# Patient Record
Sex: Female | Born: 1935 | Race: White | Hispanic: No | Marital: Married
Health system: Southern US, Community
[De-identification: ages and names within clinical notes are randomized; demographics above are authoritative.]

---

## 2004-02-19 ENCOUNTER — Other Ambulatory Visit: Payer: Self-pay

## 2004-02-20 ENCOUNTER — Observation Stay: Payer: Self-pay | Admitting: Internal Medicine

## 2004-05-09 ENCOUNTER — Ambulatory Visit: Payer: Self-pay | Admitting: Unknown Physician Specialty

## 2004-12-07 ENCOUNTER — Ambulatory Visit: Payer: Self-pay | Admitting: Unknown Physician Specialty

## 2005-05-25 ENCOUNTER — Ambulatory Visit: Payer: Self-pay | Admitting: Unknown Physician Specialty

## 2006-04-26 ENCOUNTER — Ambulatory Visit: Payer: Self-pay | Admitting: Unknown Physician Specialty

## 2006-05-07 ENCOUNTER — Ambulatory Visit: Payer: Self-pay | Admitting: Internal Medicine

## 2006-05-31 ENCOUNTER — Ambulatory Visit: Payer: Self-pay | Admitting: Unknown Physician Specialty

## 2006-08-15 ENCOUNTER — Ambulatory Visit: Payer: Self-pay | Admitting: Unknown Physician Specialty

## 2007-07-16 ENCOUNTER — Ambulatory Visit: Payer: Self-pay | Admitting: Unknown Physician Specialty

## 2008-12-01 ENCOUNTER — Ambulatory Visit: Payer: Self-pay | Admitting: Unknown Physician Specialty

## 2009-06-30 ENCOUNTER — Ambulatory Visit: Payer: Self-pay | Admitting: Unknown Physician Specialty

## 2009-09-28 ENCOUNTER — Encounter: Admission: RE | Admit: 2009-09-28 | Discharge: 2009-09-28 | Payer: Self-pay | Admitting: Neurosurgery

## 2009-10-18 ENCOUNTER — Encounter: Admission: RE | Admit: 2009-10-18 | Discharge: 2009-10-18 | Payer: Self-pay | Admitting: Neurosurgery

## 2009-12-13 ENCOUNTER — Ambulatory Visit: Payer: Self-pay | Admitting: Unknown Physician Specialty

## 2011-02-08 ENCOUNTER — Ambulatory Visit: Payer: Self-pay | Admitting: Unknown Physician Specialty

## 2013-06-02 ENCOUNTER — Emergency Department: Payer: Self-pay | Admitting: Emergency Medicine

## 2013-06-02 LAB — COMPREHENSIVE METABOLIC PANEL
ANION GAP: 5 — AB (ref 7–16)
Albumin: 3.3 g/dL — ABNORMAL LOW (ref 3.4–5.0)
Alkaline Phosphatase: 80 U/L
BUN: 16 mg/dL (ref 7–18)
Bilirubin,Total: 0.3 mg/dL (ref 0.2–1.0)
CALCIUM: 9.6 mg/dL (ref 8.5–10.1)
Chloride: 96 mmol/L — ABNORMAL LOW (ref 98–107)
Co2: 31 mmol/L (ref 21–32)
Creatinine: 1.09 mg/dL (ref 0.60–1.30)
EGFR (Non-African Amer.): 48 — ABNORMAL LOW
GFR CALC AF AMER: 55 — AB
Glucose: 116 mg/dL — ABNORMAL HIGH (ref 65–99)
Osmolality: 267 (ref 275–301)
POTASSIUM: 4.2 mmol/L (ref 3.5–5.1)
SGOT(AST): 40 U/L — ABNORMAL HIGH (ref 15–37)
SGPT (ALT): 30 U/L (ref 12–78)
SODIUM: 132 mmol/L — AB (ref 136–145)
TOTAL PROTEIN: 7.5 g/dL (ref 6.4–8.2)

## 2013-06-02 LAB — URINALYSIS, COMPLETE
BLOOD: NEGATIVE
Bilirubin,UR: NEGATIVE
GLUCOSE, UR: NEGATIVE mg/dL (ref 0–75)
Hyaline Cast: 6
Ketone: NEGATIVE
Nitrite: NEGATIVE
Ph: 7 (ref 4.5–8.0)
Protein: NEGATIVE
SPECIFIC GRAVITY: 1.015 (ref 1.003–1.030)
Squamous Epithelial: 2

## 2013-06-02 LAB — CBC
HCT: 34.5 % — AB (ref 35.0–47.0)
HGB: 11.4 g/dL — ABNORMAL LOW (ref 12.0–16.0)
MCH: 31.2 pg (ref 26.0–34.0)
MCHC: 33 g/dL (ref 32.0–36.0)
MCV: 95 fL (ref 80–100)
Platelet: 289 10*3/uL (ref 150–440)
RBC: 3.65 10*6/uL — ABNORMAL LOW (ref 3.80–5.20)
RDW: 13.4 % (ref 11.5–14.5)
WBC: 6.2 10*3/uL (ref 3.6–11.0)

## 2013-06-02 LAB — ACETAMINOPHEN LEVEL

## 2013-06-02 LAB — ETHANOL

## 2013-06-02 LAB — LIPASE, BLOOD: Lipase: 197 U/L (ref 73–393)

## 2013-06-02 LAB — SALICYLATE LEVEL: Salicylates, Serum: <1.7 mg/dL

## 2013-06-04 ENCOUNTER — Observation Stay: Payer: Self-pay

## 2013-06-04 LAB — COMPREHENSIVE METABOLIC PANEL
Albumin: 3.4 g/dL (ref 3.4–5.0)
Alkaline Phosphatase: 84 U/L
Anion Gap: 4 — ABNORMAL LOW (ref 7–16)
BILIRUBIN TOTAL: 0.3 mg/dL (ref 0.2–1.0)
BUN: 13 mg/dL (ref 7–18)
CO2: 28 mmol/L (ref 21–32)
Calcium, Total: 10 mg/dL (ref 8.5–10.1)
Chloride: 100 mmol/L (ref 98–107)
Creatinine: 1.09 mg/dL (ref 0.60–1.30)
GFR CALC AF AMER: 55 — AB
GFR CALC NON AF AMER: 48 — AB
Glucose: 101 mg/dL — ABNORMAL HIGH (ref 65–99)
OSMOLALITY: 265 (ref 275–301)
POTASSIUM: 3.7 mmol/L (ref 3.5–5.1)
SGOT(AST): 40 U/L — ABNORMAL HIGH (ref 15–37)
SGPT (ALT): 37 U/L (ref 12–78)
Sodium: 132 mmol/L — ABNORMAL LOW (ref 136–145)
TOTAL PROTEIN: 7.5 g/dL (ref 6.4–8.2)

## 2013-06-04 LAB — CBC
HCT: 36.3 % (ref 35.0–47.0)
HGB: 12.4 g/dL (ref 12.0–16.0)
MCH: 32 pg (ref 26.0–34.0)
MCHC: 34.1 g/dL (ref 32.0–36.0)
MCV: 94 fL (ref 80–100)
Platelet: 250 10*3/uL (ref 150–440)
RBC: 3.85 10*6/uL (ref 3.80–5.20)
RDW: 13.6 % (ref 11.5–14.5)
WBC: 8.1 10*3/uL (ref 3.6–11.0)

## 2013-06-04 LAB — URINALYSIS, COMPLETE
BACTERIA: NONE SEEN
BILIRUBIN, UR: NEGATIVE
Blood: NEGATIVE
Glucose,UR: NEGATIVE mg/dL (ref 0–75)
KETONE: NEGATIVE
Leukocyte Esterase: NEGATIVE
NITRITE: NEGATIVE
PROTEIN: NEGATIVE
Ph: 6 (ref 4.5–8.0)
Specific Gravity: 1.013 (ref 1.003–1.030)
WBC UR: 2 /HPF (ref 0–5)

## 2013-06-04 LAB — TROPONIN I: Troponin-I: <0.02 ng/mL

## 2013-06-05 LAB — BASIC METABOLIC PANEL
Anion Gap: 6 — ABNORMAL LOW (ref 7–16)
BUN: 11 mg/dL (ref 7–18)
Calcium, Total: 9.8 mg/dL (ref 8.5–10.1)
Chloride: 100 mmol/L (ref 98–107)
Co2: 30 mmol/L (ref 21–32)
Creatinine: 0.95 mg/dL (ref 0.60–1.30)
EGFR (African American): >60
EGFR (Non-African Amer.): 56 — ABNORMAL LOW
Glucose: 86 mg/dL (ref 65–99)
Osmolality: 271 (ref 275–301)
Potassium: 3.6 mmol/L (ref 3.5–5.1)
Sodium: 136 mmol/L (ref 136–145)

## 2013-06-05 LAB — URINE CULTURE

## 2013-10-08 ENCOUNTER — Other Ambulatory Visit: Payer: Self-pay

## 2013-10-08 LAB — URINALYSIS, COMPLETE
BILIRUBIN, UR: NEGATIVE
BLOOD: NEGATIVE
GLUCOSE, UR: NEGATIVE mg/dL (ref 0–75)
KETONE: NEGATIVE
Nitrite: NEGATIVE
PH: 5 (ref 4.5–8.0)
Protein: NEGATIVE
RBC,UR: 4 /HPF (ref 0–5)
SPECIFIC GRAVITY: 1.016 (ref 1.003–1.030)
SQUAMOUS EPITHELIAL: NONE SEEN

## 2013-10-10 LAB — URINE CULTURE

## 2013-11-17 ENCOUNTER — Inpatient Hospital Stay: Payer: Self-pay | Admitting: Internal Medicine

## 2013-11-17 LAB — COMPREHENSIVE METABOLIC PANEL
ALK PHOS: 93 U/L
ALT: 40 U/L
ANION GAP: 10 (ref 7–16)
AST: 34 U/L (ref 15–37)
Albumin: 2.9 g/dL — ABNORMAL LOW (ref 3.4–5.0)
BILIRUBIN TOTAL: 0.2 mg/dL (ref 0.2–1.0)
BUN: 21 mg/dL — ABNORMAL HIGH (ref 7–18)
CO2: 24 mmol/L (ref 21–32)
Calcium, Total: 9.4 mg/dL (ref 8.5–10.1)
Chloride: 103 mmol/L (ref 98–107)
Creatinine: 1.04 mg/dL (ref 0.60–1.30)
EGFR (Non-African Amer.): 54 — ABNORMAL LOW
GLUCOSE: 106 mg/dL — AB (ref 65–99)
Osmolality: 277 (ref 275–301)
POTASSIUM: 4.1 mmol/L (ref 3.5–5.1)
Sodium: 137 mmol/L (ref 136–145)
Total Protein: 6.9 g/dL (ref 6.4–8.2)

## 2013-11-17 LAB — URINALYSIS, COMPLETE
BLOOD: NEGATIVE
Bilirubin,UR: NEGATIVE
Glucose,UR: NEGATIVE mg/dL (ref 0–75)
Ketone: NEGATIVE
Nitrite: NEGATIVE
PROTEIN: NEGATIVE
Ph: 6 (ref 4.5–8.0)
SPECIFIC GRAVITY: 1.018 (ref 1.003–1.030)
Squamous Epithelial: 3

## 2013-11-17 LAB — CBC
HCT: 35.8 % (ref 35.0–47.0)
HGB: 12.1 g/dL (ref 12.0–16.0)
MCH: 32.3 pg (ref 26.0–34.0)
MCHC: 33.6 g/dL (ref 32.0–36.0)
MCV: 96 fL (ref 80–100)
PLATELETS: 249 10*3/uL (ref 150–440)
RBC: 3.73 10*6/uL — AB (ref 3.80–5.20)
RDW: 13.4 % (ref 11.5–14.5)
WBC: 6.8 10*3/uL (ref 3.6–11.0)

## 2013-11-17 LAB — CK-MB

## 2013-11-17 LAB — TROPONIN I: Troponin-I: <0.02 ng/mL

## 2013-11-18 LAB — TROPONIN I

## 2013-11-18 LAB — CK-MB
CK-MB: 0.5 ng/mL (ref 0.5–3.6)
CK-MB: <0.5 ng/mL — ABNORMAL LOW (ref 0.5–3.6)

## 2013-11-21 LAB — URINE CULTURE

## 2013-12-03 ENCOUNTER — Observation Stay: Payer: Self-pay | Admitting: Internal Medicine

## 2013-12-03 LAB — URINALYSIS, COMPLETE
BACTERIA: NONE SEEN
BILIRUBIN, UR: NEGATIVE
Blood: NEGATIVE
Glucose,UR: NEGATIVE mg/dL (ref 0–75)
Hyaline Cast: 2
Ketone: NEGATIVE
Leukocyte Esterase: NEGATIVE
Nitrite: NEGATIVE
PH: 5 (ref 4.5–8.0)
Protein: NEGATIVE
RBC,UR: 2 /HPF (ref 0–5)
SPECIFIC GRAVITY: 1.021 (ref 1.003–1.030)
Squamous Epithelial: <1
WBC UR: 1 /HPF (ref 0–5)

## 2013-12-03 LAB — BASIC METABOLIC PANEL
ANION GAP: 5 — AB (ref 7–16)
BUN: 23 mg/dL — AB (ref 7–18)
CHLORIDE: 105 mmol/L (ref 98–107)
CO2: 30 mmol/L (ref 21–32)
Calcium, Total: 9.5 mg/dL (ref 8.5–10.1)
Creatinine: 1.11 mg/dL (ref 0.60–1.30)
EGFR (Non-African Amer.): 50 — ABNORMAL LOW
GLUCOSE: 109 mg/dL — AB (ref 65–99)
OSMOLALITY: 284 (ref 275–301)
POTASSIUM: 4.2 mmol/L (ref 3.5–5.1)
Sodium: 140 mmol/L (ref 136–145)

## 2013-12-03 LAB — TROPONIN I: Troponin-I: <0.02 ng/mL

## 2013-12-03 LAB — CBC WITH DIFFERENTIAL/PLATELET
BASOS ABS: 0.1 10*3/uL (ref 0.0–0.1)
BASOS PCT: 1 %
Eosinophil #: 0.5 10*3/uL (ref 0.0–0.7)
Eosinophil %: 5.1 %
HCT: 34.4 % — ABNORMAL LOW (ref 35.0–47.0)
HGB: 11.4 g/dL — AB (ref 12.0–16.0)
LYMPHS ABS: 1.8 10*3/uL (ref 1.0–3.6)
LYMPHS PCT: 18.2 %
MCH: 31.9 pg (ref 26.0–34.0)
MCHC: 33.1 g/dL (ref 32.0–36.0)
MCV: 96 fL (ref 80–100)
MONO ABS: 0.8 x10 3/mm (ref 0.2–0.9)
MONOS PCT: 8.2 %
Neutrophil #: 6.6 10*3/uL — ABNORMAL HIGH (ref 1.4–6.5)
Neutrophil %: 67.5 %
PLATELETS: 296 10*3/uL (ref 150–440)
RBC: 3.58 10*6/uL — AB (ref 3.80–5.20)
RDW: 13.3 % (ref 11.5–14.5)
WBC: 9.9 10*3/uL (ref 3.6–11.0)

## 2013-12-03 LAB — CK TOTAL AND CKMB (NOT AT ARMC): CK, TOTAL: 22 U/L — AB

## 2013-12-04 LAB — CBC WITH DIFFERENTIAL/PLATELET
BASOS ABS: 0.1 10*3/uL (ref 0.0–0.1)
BASOS PCT: 1 %
EOS ABS: 0.3 10*3/uL (ref 0.0–0.7)
Eosinophil %: 3.9 %
HCT: 31.4 % — ABNORMAL LOW (ref 35.0–47.0)
HGB: 10.5 g/dL — ABNORMAL LOW (ref 12.0–16.0)
LYMPHS ABS: 1.7 10*3/uL (ref 1.0–3.6)
Lymphocyte %: 21.7 %
MCH: 31.9 pg (ref 26.0–34.0)
MCHC: 33.5 g/dL (ref 32.0–36.0)
MCV: 95 fL (ref 80–100)
MONO ABS: 0.6 x10 3/mm (ref 0.2–0.9)
Monocyte %: 7.6 %
Neutrophil #: 5 10*3/uL (ref 1.4–6.5)
Neutrophil %: 65.8 %
Platelet: 248 10*3/uL (ref 150–440)
RBC: 3.29 10*6/uL — ABNORMAL LOW (ref 3.80–5.20)
RDW: 13.3 % (ref 11.5–14.5)
WBC: 7.6 10*3/uL (ref 3.6–11.0)

## 2013-12-04 LAB — BASIC METABOLIC PANEL
Anion Gap: 4 — ABNORMAL LOW (ref 7–16)
BUN: 21 mg/dL — AB (ref 7–18)
CALCIUM: 8.8 mg/dL (ref 8.5–10.1)
CHLORIDE: 107 mmol/L (ref 98–107)
CO2: 30 mmol/L (ref 21–32)
CREATININE: 0.88 mg/dL (ref 0.60–1.30)
EGFR (African American): >60
GLUCOSE: 87 mg/dL (ref 65–99)
OSMOLALITY: 284 (ref 275–301)
POTASSIUM: 3.8 mmol/L (ref 3.5–5.1)
Sodium: 141 mmol/L (ref 136–145)

## 2013-12-04 LAB — MAGNESIUM: MAGNESIUM: 1.6 mg/dL — AB

## 2013-12-04 LAB — TSH: Thyroid Stimulating Horm: 1.93 u[IU]/mL

## 2013-12-05 LAB — URINE CULTURE

## 2013-12-08 LAB — CULTURE, BLOOD (SINGLE)

## 2013-12-20 ENCOUNTER — Emergency Department: Payer: Self-pay | Admitting: Emergency Medicine

## 2013-12-20 LAB — COMPREHENSIVE METABOLIC PANEL
ALK PHOS: 113 U/L
ALT: 30 U/L
Albumin: 3.1 g/dL — ABNORMAL LOW (ref 3.4–5.0)
Anion Gap: 9 (ref 7–16)
BILIRUBIN TOTAL: 0.3 mg/dL (ref 0.2–1.0)
BUN: 21 mg/dL — AB (ref 7–18)
CALCIUM: 10.2 mg/dL — AB (ref 8.5–10.1)
CHLORIDE: 101 mmol/L (ref 98–107)
Co2: 29 mmol/L (ref 21–32)
Creatinine: 1.12 mg/dL (ref 0.60–1.30)
EGFR (African American): >60
GFR CALC NON AF AMER: 50 — AB
Glucose: 151 mg/dL — ABNORMAL HIGH (ref 65–99)
Osmolality: 283 (ref 275–301)
Potassium: 3.9 mmol/L (ref 3.5–5.1)
SGOT(AST): 39 U/L — ABNORMAL HIGH (ref 15–37)
SODIUM: 139 mmol/L (ref 136–145)
TOTAL PROTEIN: 7.7 g/dL (ref 6.4–8.2)

## 2013-12-20 LAB — URINALYSIS, COMPLETE
Bilirubin,UR: NEGATIVE
Blood: NEGATIVE
Glucose,UR: NEGATIVE mg/dL (ref 0–75)
Ketone: NEGATIVE
NITRITE: NEGATIVE
Ph: 5 (ref 4.5–8.0)
Protein: 30
RBC,UR: 53 /HPF (ref 0–5)
Specific Gravity: 1.025 (ref 1.003–1.030)
Squamous Epithelial: 1
WBC UR: 798 /HPF (ref 0–5)

## 2013-12-20 LAB — CBC
HCT: 37.5 % (ref 35.0–47.0)
HGB: 12.4 g/dL (ref 12.0–16.0)
MCH: 31.7 pg (ref 26.0–34.0)
MCHC: 33 g/dL (ref 32.0–36.0)
MCV: 96 fL (ref 80–100)
Platelet: 308 10*3/uL (ref 150–440)
RBC: 3.91 10*6/uL (ref 3.80–5.20)
RDW: 13.5 % (ref 11.5–14.5)
WBC: 12.8 10*3/uL — AB (ref 3.6–11.0)

## 2013-12-20 LAB — MAGNESIUM: Magnesium: 1.9 mg/dL

## 2013-12-20 LAB — TROPONIN I: Troponin-I: <0.02 ng/mL

## 2014-02-01 LAB — COMPREHENSIVE METABOLIC PANEL
ALBUMIN: 3 g/dL — AB (ref 3.4–5.0)
ALK PHOS: 125 U/L — AB
ALT: 46 U/L
ANION GAP: 6 — AB (ref 7–16)
AST: 54 U/L — AB (ref 15–37)
BUN: 17 mg/dL (ref 7–18)
Bilirubin,Total: 0.1 mg/dL — ABNORMAL LOW (ref 0.2–1.0)
CHLORIDE: 99 mmol/L (ref 98–107)
CREATININE: 0.96 mg/dL (ref 0.60–1.30)
Calcium, Total: 9.7 mg/dL (ref 8.5–10.1)
Co2: 28 mmol/L (ref 21–32)
EGFR (African American): >60
EGFR (Non-African Amer.): 59 — ABNORMAL LOW
GLUCOSE: 115 mg/dL — AB (ref 65–99)
Osmolality: 269 (ref 275–301)
Potassium: 4.4 mmol/L (ref 3.5–5.1)
Sodium: 133 mmol/L — ABNORMAL LOW (ref 136–145)
TOTAL PROTEIN: 7.7 g/dL (ref 6.4–8.2)

## 2014-02-01 LAB — CARBAMAZEPINE LEVEL, TOTAL: CARBAMAZEPINE: 5.7 ug/mL (ref 4.0–12.0)

## 2014-02-01 LAB — URINALYSIS, COMPLETE
BACTERIA: NONE SEEN
BILIRUBIN, UR: NEGATIVE
BLOOD: NEGATIVE
GLUCOSE, UR: NEGATIVE mg/dL (ref 0–75)
Hyaline Cast: 11
KETONE: NEGATIVE
LEUKOCYTE ESTERASE: NEGATIVE
Nitrite: NEGATIVE
PH: 7 (ref 4.5–8.0)
Protein: NEGATIVE
SPECIFIC GRAVITY: 1.015 (ref 1.003–1.030)
WBC UR: 1 /HPF (ref 0–5)

## 2014-02-01 LAB — CBC
HCT: 36 % (ref 35.0–47.0)
HGB: 12 g/dL (ref 12.0–16.0)
MCH: 31.8 pg (ref 26.0–34.0)
MCHC: 33.3 g/dL (ref 32.0–36.0)
MCV: 96 fL (ref 80–100)
Platelet: 283 10*3/uL (ref 150–440)
RBC: 3.77 10*6/uL — ABNORMAL LOW (ref 3.80–5.20)
RDW: 13.3 % (ref 11.5–14.5)
WBC: 9.3 10*3/uL (ref 3.6–11.0)

## 2014-02-01 LAB — TROPONIN I: Troponin-I: <0.02 ng/mL

## 2014-02-02 ENCOUNTER — Observation Stay: Payer: Self-pay | Admitting: Internal Medicine

## 2014-02-02 LAB — TROPONIN I
Troponin-I: <0.02 ng/mL
Troponin-I: <0.02 ng/mL

## 2014-03-17 ENCOUNTER — Ambulatory Visit
Admit: 2014-03-17 | Disposition: A | Discharge status: Home / Self Care | Payer: Self-pay | Attending: Hospice and Palliative Medicine | Admitting: Hospice and Palliative Medicine

## 2014-03-23 ENCOUNTER — Emergency Department: Payer: Self-pay | Admitting: Emergency Medicine

## 2014-04-03 ENCOUNTER — Inpatient Hospital Stay: Payer: Self-pay | Admitting: Internal Medicine

## 2014-04-09 LAB — BASIC METABOLIC PANEL
Anion Gap: 2 — ABNORMAL LOW (ref 7–16)
BUN: 5 mg/dL — ABNORMAL LOW
CHLORIDE: 103 mmol/L
Calcium, Total: 8.8 mg/dL — ABNORMAL LOW
Co2: 32 mmol/L
Creatinine: 0.63 mg/dL
EGFR (African American): >60
EGFR (Non-African Amer.): >60
Glucose: 84 mg/dL
Potassium: 4.1 mmol/L
Sodium: 137 mmol/L

## 2014-04-09 LAB — CLOSTRIDIUM DIFFICILE(ARMC)

## 2014-04-13 LAB — STOOL CULTURE

## 2014-04-17 ENCOUNTER — Ambulatory Visit
Admit: 2014-04-17 | Disposition: A | Discharge status: Home / Self Care | Payer: Self-pay | Attending: Hospice and Palliative Medicine | Admitting: Hospice and Palliative Medicine

## 2014-04-24 LAB — CULTURE, BLOOD (SINGLE)

## 2014-05-09 NOTE — H&P (Signed)
PATIENT NAME:  Audrey [name], Audrey [name] MR#:  [id number] DATE OF BIRTH:  1930/09/19  DATE OF ADMISSION:  06/04/2013  PRIMARY CARE PHYSICIAN: Lora Paula, PA, at [facility].   REASON FOR ADMISSION: Possible seizures or syncope.   HISTORY OF PRESENT ILLNESS: This is a very pleasant 81 year old female whose husband died 2 weeks ago. She has a baseline history of advancing Alzheimer's and is on Namenda and Aricept. She has had 3 episodes where she has become unresponsive and stares off into the distance, lasting 1 to 2 minutes. When she comes to, she is somewhat confused. She has not had any actual loss of consciousness or syncope or seizure activity noted with it. She was seen May 18th at the ER and diagnosed with a urinary tract infection. She was discharged on Bactrim. These symptoms recurred again today and she was brought to the Emergency Room by EMS. Apparently at the time of EMS arrival, her sugars were checked and those were normal. She has been eating well. She is in between these episodes at her baseline of mental status, which is confusion, not oriented to place but able to recognize her daughters. She has been living at home since her husband died alone, but they have 24-hour care arranged. She has also been quite unsteady on her feet and has fallen a few times. She does have recurrent urinary infections and follows with urology and is on Macrobid at baseline.   She has had no chest pain, shortness of breath, cough, fevers, chills, headaches, or focal neurological deficits.   PAST MEDICAL HISTORY: 1.  Hypertension.  2.  Hyperlipidemia.  3.  Spinal stenosis.  4.  Advancing Alzheimer's, following with [facility].  5.  Degenerative arthritis.   PAST SURGICAL HISTORY:  1.  Right cataract surgery.  2.  Skin cancer removal.   SOCIAL HISTORY: The patient's husband died 2 weeks ago. She is now living at home but has 24-hour caregiver. 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 She does not smoke, drink or use drugs.   FAMILY HISTORY: Significant for diabetes, hypertension, and coronary artery disease.   ALLERGIES: PENICILLIN AND AZITHROMYCIN.   REVIEW OF SYSTEMS: Unable to be obtained from the patient due to advanced dementia.   PHYSICAL EXAMINATION: VITAL SIGNS: Temperature 98.1, pulse 71, blood pressure 140/61, respirations 18, sat 99% on room air.  GENERAL: She is pleasant, lying in bed. She is awake and interactive, but has advanced dementia. She cannot name the year or where she is. She does know her name and can recognize her daughter. She cannot recognize her daughter-in-law.  HEENT: Pupils are equal, round, reactive to light and accommodation. Extraocular movements are intact. Sclerae anicteric. Oropharynx clear. Mucous membranes are moist.  NECK: Supple.  HEART: Regular.  LUNGS: Clear.  ABDOMEN: Soft, nontender, nondistended. No hepatosplenomegaly.  EXTREMITIES: No clubbing, cyanosis or edema.  SKIN: No cellulitis or rash.  NEUROLOGIC: Oriented as per above. Nonfocal neuro exam grossly.   LABORATORY AND RADIOLOGICAL DATA: Labs reviewed. CT of the head done May 28 showed stable ventriculomegaly out of proportion to the degree of cerebral atrophy, could reflect normal-pressure hydrocephalus. No acute intracranial findings or mass effect. Chest x-ray negative. White blood count is 8.1, hemoglobin 12.4, platelets 250. Renal function shows BUN 13, creatinine 1.09; sodium is low at 132, glucose 101. Lipase was normal when seen several days ago. Ethanol level was normal. LFTs normal, except slight elevation of AST to 40.  Troponins negative. Urinalysis done today shows 2 white cells. On the 18th, she had 238 white cells and cultures are growing gram-negative rods greater than 100,000. Tylenol and salicylate levels were normal.   IMPRESSION: A pleasant 81 year old female with history of advancing Alzheimer's disease, hypertension,  hyperlipidemia, and spinal stenosis, admitted with what sounds like absence seizures. She has had 3 episodes now where she stares off into the distance and is unresponsive for a few minutes. She has had no prior seizure disorder. Initial work-up is negative except for a urinary tract infection, which has been treated with improvement in her urinalysis.   PLAN: 1.  Possible seizures: Will consult neurology and consider EEG. CT does show potential normal-pressure hydrocephalus, but she has been followed for several years at neurology in [place]. I am not sure if there would be further work-up necessary at this time will defer to neurology.  2.  Neuro checks q.4 hours.  3.  Telemetry monitoring.  4.  Continue blood pressure control with hydrochlorothiazide and benazepril. 5.  Gentle IV fluids for the next 24 hours.  6.  For her dementia, continue the Aricept and Namenda.  7.  Hyperlipidemia: Continue Vytorin. 8.  DVT prophylaxis: Will place the patient on subcutaneous heparin.  9.  Recent urinary infection: Continue the Bactrim she is on, and when she is discharged, she can restart Macrobid.    ____________________________ Stann Mainlandavid P. Sampson GoonFitzgerald, MD dpf:jcm D: 06/04/2013 20:35:29 ET T: 06/04/2013 20:55:48 ET JOB#: [id number]  cc: Stann Mainlandavid P. Sampson GoonFitzgerald, MD, <Dictator> Nikita Surman Sampson GoonFITZGERALD MD ELECTRONICALLY SIGNED 06/09/2013 20:59

## 2014-05-09 NOTE — Discharge Summary (Signed)
PATIENT NAME:  Audrey [name], Audrey [name] MR#:  [id number] DATE OF BIRTH:  [birth year]  DATE OF ADMISSION:  11/17/2013 DATE OF DISCHARGE:  11/19/2013  DISCHARGE DIAGNOSES: 1.  Breakthrough seizure, history of absence seizures. 2.  Urinary tract infection.   DISCHARGE MEDICATIONS: She takes Keppra 500 mg p.o. b.i.[name]., Namenda 10 mg p.o. b.i.[name]., Toviaz 8 mg p.o. daily, Vicodin 10/20 mg p.o. daily, Cipro 500 mg p.o. b.i.[name]. for 5 days, aspirin 81 mg daily, benazepril 20 mg p.o. daily, Tessalon Perles as needed for cough, hydrochlorothiazide 12.5 mg p.o. daily, aspirin 81 mg daily.   CONSULTATIONS: Cardiology consult with Dr. Gwen PoundsKowalski.   HOSPITAL COURSE: This is an 81 year old female patient with a history of absence seizures who follows up with Dr. Cristopher PeruHemang Shah on a regular basis brought in by the family for an episode of staring and also confusion after that. The patient's family is concerned that she had a seizure at home. The patient's last seizure was in May and since then the patient is on Keppra and she follows up with Dr. Cristopher PeruHemang Shah and followed up with him a week ago, had an EEG done, and the patient is continued on Keppra 500 mg twice daily for possible absence seizures. So, she is for syncope, but on further questioning it looked like she had a seizure at home. The patient admitted to hospitalist service. She was given Keppra 500 mg IV b.i.[name]. and she had a workup done which showed a UTI with white count 404 in the urine and 3+ bacteria. Troponins have been negative. The patient's urine culture showed more than 100,000 colonies of gram-negative rods. The patient did not have further seizures. Her other lab work was unremarkable. She had a normal sinus rhythm. Troponins have been negative, and CBC and METB were within normal limits. The patient was seen by Dr. Gwen PoundsKowalski because of syncope. The patient did not have any further workup > and the patient's heart rate stayed stable around 60s and she did not have any  arrhythmias on telemetry.  PHYSICAL EXAMINATION: On the day of discharge showed:  GENERAL: She is alert, oriented, follows commands.  CARDIOVASCULAR: S1, S2 regular.  LUNGS: Clear to auscultation.  NEUROLOGIC: Cranial nerves II through XII were intact, 5/5 strength upper and lower extremities. Sensation intact. DTRs 2+ bilaterally.  HOSPITAL COURSE: Patient had an uneventful hospital course and discharged home in stable condition. I told the daughter that she can see Dr. Cristopher PeruHemang Shah in the clinic and most likely precipitating factor at this time may be UTI, caused her to have breakthrough seizure.  PRIMARY CARE DOCTOR: Audrey MinisterMiriam McLaughlin, PA-C.    ____________________________ Katha HammingSnehalatha Keyarra Rendall, MD sk:ST [name]: 11/20/2013 19:57:27 ET T: 11/20/2013 22:29:15 ET JOB#: [id number]  cc: Katha HammingSnehalatha Alexandros Ewan, MD, <Dictator> Miriam "[name]" Merlinda FrederickMcLaughlin, [place] JerseyPA-C Katha HammingSNEHALATHA Jebadiah Imperato MD ELECTRONICALLY SIGNED 12/04/2013 8:25

## 2014-05-09 NOTE — Consult Note (Signed)
PATIENT NAME:  Audrey [name], Audrey [name] MR#:  [id number] DATE OF BIRTH:  [birth year]  DATE OF CONSULTATION:  11/18/2013  REFERRING PHYSICIAN:  Susa GriffinsPadmaja Vasireddy, MD CONSULTING PHYSICIAN:  Lamar BlinksBruce J. Gayna Braddy, MD  REASON FOR CONSULTATION: Syncope, essential hypertension with mixed hyperlipidemia.   CHIEF COMPLAINT: The patient is demented and cannot discuss but did get information from the family.  HISTORY OF PRESENT ILLNESS:  This is an 81 year old female who has had significant episodes of syncope in the past and has had a full work-up prior to this admission. She has not had any evidence of significant carotid atherosclerosis, rhythm disturbances, cardiovascular issues including previous myocardial infarction, valvular heart disease causing this. She does have chronic essential hypertension currently stable and mixed hyperlipidemia also stable. She has had an episode of syncope and/or possible seizure for which the patient was sitting in the chair and lost consciousness for approximately 3 to 5 minutes. During that period of time, she did not have any convulsions and did not have any other dysarthria or other significant neurologic abnormalities. Afterword she did not have any loss of pulse and/or problems with blood pressure. Since then she has been on telemetry showing no evidence of rhythm disturbances and stable. Her blood pressure has been stable as well. Troponin is normal and EKG shows normal sinus rhythm.  REVIEW OF SYSTEMS:  Remainder cannot be assessed.   PAST MEDICAL HISTORY: 1.  Syncope.  2.  Essential hypertension.  3.  Mixed hyperlipidemia.   FAMILY HISTORY: No family members with early onset of cardiovascular disease or hypertension.   SOCIAL HISTORY: The patient does not have any alcohol or tobacco use.    MEDICATIONS:  As listed.   ALLERGIES: As listed.   PHYSICAL EXAMINATION: VITAL SIGNS: Blood pressure is 124/68, bilaterally, heart rate 66 upright, reclining, and regular.   GENERAL: She is a well-appearing, demented female in no acute distress.  HEENT: No icterus, thyromegaly, ulcers, hemorrhages, or xanthelasma.  CARDIOVASCULAR: Regular rate and rhythm. Normal S1 and S2. Distant heart sounds. PMI is diffuse. Carotid upstrokes normal without bruit. Jugular venous pressure is normal.  LUNGS: Have few basilar crackles with normal respirations.  ABDOMEN: Soft, nontender. No apparent hepatosplenomegaly or masses. Cannot assess due to increased abdominal girth.  EXTREMITIES: 2+ radial, femoral, dorsal pedal pulses, with trace lower extremity edema. No cyanosis, clubbing or ulcers.  NEUROLOGIC: The patient is not oriented to time, place, or person, but is alert.  ASSESSMENT: An 81 year old female with essential hypertension and mixed hyperlipidemia with recurrent syncope most consistent with possible seizure disorder on appropriate Keppra and stable at this time with no further known apparent cardiovascular causes with previous work-up and currently no evidence of myocardial infarction or rhythm disturbances.   RECOMMENDATIONS: 1.  Continue essential hypertension treatment as necessary with medication management following closely for any concerns of hypotension that could be a source of her current syncope.  2.  No further intervention or diagnostics from the cardiac standpoint due to normal troponin.  3.  Continue treatment of possible neurologic abnormalities, including seizure disorder with Keppra.  4.  Ambulation.  If able to allow further evaluation and treatment options, if cardiac origin is a concern.    ____________________________ Lamar BlinksBruce J. Jaedah Lords, MD bjk:jp [name]: 11/18/2013 08:11:45 ET T: 11/18/2013 08:34:45 ET JOB#: [id number]  cc: Lamar BlinksBruce J. Diavion Labrador, MD, <Dictator> Lamar BlinksBRUCE J Kaleiyah Polsky MD ELECTRONICALLY SIGNED 11/21/2013 7:51

## 2014-05-09 NOTE — Consult Note (Signed)
Referring Physician:  Angelena Form :   Primary Care Physician:  Angelena Form : [facility], Internal Medicine, [address], [place], [place] [address], [phone]  Reason for Consult: Admit Date: 04-Jun-2013  Chief Complaint: possible seizure  Reason for Consult: seizure   History of Present Illness: History of Present Illness:   81 yo RHD F presents to [facility] secondary to having an episode of passing out x 3.  Three days ago, pt was sitting on her walker when she had a blank stare and was unresponsive for about one minute.  She had bowel incontinence with that episode as well as confusion afterward.  Fifteen minutes after that episode she had another one that last 2 minutes.  She went to hospital at that time and was sent home with UTI treatment.  After completing that she had another episode.  She is now back to baseline.  ROS:  General denies complaints   HEENT no complaints   Lungs no complaints   Cardiac no complaints   GI no complaints   GU no complaints   Musculoskeletal no complaints   Extremities no complaints   Skin no complaints   Neuro no complaints   Endocrine no complaints   Psych no complaints   Past Medical/Surgical Hx:  HTN:   Hypercholesterolemia:   Dementia:   Hysterectomy - Partial:   Carpal Tunnel Release:   Cataract Extraction:   Past Medical/ Surgical Hx:  Past Medical History as above   Past Surgical History as above   Home Medications: Medication Instructions Last Modified Date/Time  Bactrim DS 800 mg-160 mg oral tablet 1 tab(s) orally 2 times a day 20-May-15 20:36  omeprazole 20 mg oral delayed release capsule 1 cap(s) orally once a day 20-May-15 20:36  donepezil 10 mg oral tablet 1 tab(s) orally once a day 20-May-15 20:36  hydrochlorothiazide 12.5 mg oral tablet 1 tab(s) orally once a day 20-May-15 20:36  benazepril 20 mg oral tablet 1 tab(s) orally once a day 20-May-15 20:36  nitrofurantoin  macrocrystals 100 mg oral capsule 1 cap(s) orally once a day 20-May-15 20:36  Toviaz 8 mg oral tablet, extended release 1 tab(s) orally once a day 20-May-15 20:36  Vytorin 10 mg-20 mg oral tablet 1 tab(s) orally once a day (in the evening) 20-May-15 20:36  Namenda 10 mg oral tablet 1 tab(s) orally 2 times a day 20-May-15 20:36  [place] - oral capsule 1 cap(s) orally once a day 20-May-15 20:36  aspirin 81 mg oral tablet 1 tab(s) orally once a day 20-May-15 20:36   Allergies:  PCN: Unknown  Allergies:  Allergies NKDA   Social/Family History: Employment Status: retired  Lives With: children  Living Arrangements: house  Social History: no tob, no EtOH, no illicits  Family History: no seizures or strokes   Vital Signs: **Vital Signs.:   21-May-15 13:18  Vital Signs Type Q 4hr  Temperature Temperature (F) 98.2  Celsius 36.7  Pulse Pulse 74  Respirations Respirations 18  Systolic BP Systolic BP 680  Diastolic BP (mmHg) Diastolic BP (mmHg) 64  Mean BP 81  Pulse Ox % Pulse Ox % 93  Pulse Ox Activity Level  At rest  Oxygen Delivery Room Air/ 21 %  Telemetry pattern Cardiac Rhythm Normal sinus rhythm    14:30  Pulse Pulse 73  Pulse source if not from Vital Sign Device per Telemetry Clerk  Telemetry pattern Cardiac Rhythm Normal sinus rhythm; pattern reported by [employer]   Physical  Exam: General: overweight, NAD  HEENT: normocephalic, sclera nonicteric, oropharynx clear  Neck: supple, no JVD, no bruits  Chest: CTA B, no wheezing, good movement  Cardiac: RRR, no murmurs, no edema, 2+ pulses  Extremities: no C/C/E, FROM   Neurologic Exam: Mental Status: alert but oriented only to person, follows simple commands, no obvious aphasia  Cranial Nerves: PERRLA, EOMI, nl VF, face symmetric, tongue midline, shoulder shrug equal  Motor Exam: 5/5 B normal, tone, no tremor  Deep Tendon Reflexes: 2+/4 B, plantars downgoing B, no Hoffman  Sensory Exam: pinprick,  temperature, and vibration intact B  Coordination: FTN and HTS WNL   Lab Results: Hepatic:  20-May-15 17:34   Bilirubin, Total 0.3  Alkaline Phosphatase 84 (45-117 NOTE: New Reference Range 12/06/12)  SGPT (ALT) 37  SGOT (AST)  40  Total Protein, Serum 7.5  Albumin, Serum 3.4  Routine Chem:  21-May-15 05:56   Glucose, Serum 86  BUN 11  Creatinine (comp) 0.95  Sodium, Serum 136  Potassium, Serum 3.6  Chloride, Serum 100  CO2, Serum 30  Calcium (Total), Serum 9.8  Anion Gap  6  Osmolality (calc) 271  eGFR (African American) >60  eGFR (Non-African American)  56 (eGFR values <21m/min/1.73 m2 may be an indication of chronic kidney disease (CKD). Calculated eGFR is useful in patients with stable renal function. The eGFR calculation will not be reliable in acutely ill patients when serum creatinine is changing rapidly. It is not useful in  patients on dialysis. The eGFR calculation may not be applicable to patients at the low and high extremes of body sizes, pregnant women, and vegetarians.)  Cardiac:  20-May-15 17:34   Troponin I < 0.02 (0.00-0.05 0.05 ng/mL or less: NEGATIVE  Repeat testing in 3-6 hrs  if clinically indicated. >0.05 ng/mL: POTENTIAL  MYOCARDIAL INJURY. Repeat  testing in 3-6 hrs if  clinically indicated. NOTE: An increase or decrease  of 30% or more on serial  testing suggests a  clinically important change)  Routine UA:  20-May-15 18:33   Color (UA) Yellow  Clarity (UA) Hazy  Glucose (UA) Negative  Bilirubin (UA) Negative  Ketones (UA) Negative  Specific Gravity (UA) 1.013  Blood (UA) Negative  pH (UA) 6.0  Protein (UA) Negative  Nitrite (UA) Negative  Leukocyte Esterase (UA) Negative (Result(s) reported on 04 Jun 2013 at 06:58PM.)  RBC (UA) 2 /HPF  WBC (UA) 2 /HPF  Bacteria (UA) NONE SEEN  Epithelial Cells (UA) <1 /HPF  Hyaline Cast (UA) 3 /LPF (Result(s) reported on 04 Jun 2013 at 06:58PM.)  Routine Hem:  20-May-15 17:34   WBC (CBC)  8.1  RBC (CBC) 3.85  Hemoglobin (CBC) 12.4  Hematocrit (CBC) 36.3  Platelet Count (CBC) 250 (Result(s) reported on 04 Jun 2013 at 06:03PM.)  MCV 94  MCH 32.0  MCHC 34.1  RDW 13.6   Radiology Results: CT:    20-May-15 18:08, CT Head Without Contrast  CT Head Without Contrast   REASON FOR EXAM:    ams  COMMENTS:   May transport without cardiac monitor    PROCEDURE: CT  - CT HEAD WITHOUT CONTRAST  - Jun 04 2013  6:08PM     CLINICAL DATA:  Syncopal episode.    EXAM:  CT HEAD WITHOUT CONTRAST    TECHNIQUE:  Contiguous axial images were obtained from the base of the skull  through the vertex without intravenous contrast.    COMPARISON:  06/02/2013.  FINDINGS:  Stable age related cerebral atrophy, ventriculomegaly and  periventricular white matter disease. No extra-axial fluid  collections are identified. No CT findings for acute hemispheric  infarction or intracranial hemorrhage. No mass lesions. The  brainstem and cerebellum are normal.     IMPRESSION:  Stable ventriculomegaly out of proportion to the degree of cerebral  atrophy could reflect normal pressure hydrocephalus.    No acute intracranial findings or mass lesion.      Electronically Signed    By: Kalman Jewels M.D.    On: 06/04/2013 18:16         Verified By: Marlane Hatcher, M.D.,   Radiology Impression: Radiology Impression: CT of head personally reviewed by me and shows severe frontotemporal atrophy, mild white matter disease   Impression/Recommendations: Recommendations:   prior notes reviewed by me reviewed by me   Seizure-  this was likely provoked by UTI in past but pt is at risk for more events with severe atrophy Dementia-  appears more frontotemporal on CT but has diagnosis of Alheizmers, this is stable start Keppra 549m BID can continue Aricept and Namenda at home doses since pt has been on them for years, these can lower seizure threshold treat UTI keep Mg > 2, Ca > 8 and Na > 130 no  driving or operating heavy machinery x 6 months pt can go home tomorrow but needs f/u with Neurology in 3 months  Electronic Signatures: SJamison Neighbor(MD)  (Signed 21-May-15 15:29)  Authored: REFERRING PHYSICIAN, Primary Care Physician, Consult, History of Present Illness, Review of Systems, PAST MEDICAL/SURGICAL HISTORY, HOME MEDICATIONS, ALLERGIES, Social/Family History, NURSING VITAL SIGNS, Physical Exam-, LAB RESULTS, RADIOLOGY RESULTS, Recommendations   Last Updated: 21-May-15 15:29 by SJamison Neighbor(MD)

## 2014-05-09 NOTE — Discharge Summary (Signed)
PATIENT NAME:  Audrey [name], Audrey [name] MR#:  [id number] DATE OF BIRTH:  [birth year]  DATE OF ADMISSION:  12/03/2013 DATE OF DISCHARGE:  12/05/2013  DISCHARGE DIAGNOSES: 1.  Breakthrough seizures.  2.  Hypomagnesemia.  3.  Hypertension.  4.  Alzheimer dementia.  5.  Chronic urinary tract infections.   6.  Hyperlipidemia.  7.  Hypertension.   DISCHARGE MEDICATIONS:  1.  Benazepril 20 mg p.o. daily.  2.  Aspirin 81 mg daily.  3.  Namenda 10 mg p.o. b.i.d.  4.  Donepezil 10 mg p.o. daily.  5.  Tessalon Perles 100 mg p.o. t.i.d. as needed for cough.  6.  Hydrochlorothiazide 12.5 mg p.o. daily.  7.  Omeprazole 20 mg p.o. daily.  8.  philips colon health for constipation as needed.  9.  Toviaz 8 mg p.o. daily.  10.   Vytorin 10/20 mg p.o. daily.  11.   Macrodantin 100 mg every day at bedtime.  12.   Keppra, dose increased from 500 mg to 750 mg every 12 hours.   DISPOSITION: Discharged home with home hospice services and hospice can arrange physical therapy.    DIET: Low-sodium diet.   FOLLOWUP: Hemang K. Sherryll Burger, MD in 2 weeks.   CONSULTATIONS: Physical therapy consult.   HOSPITAL COURSE:  1.  An 81 year old female patient with history of dementia. Has a history of seizures, comes in because of breakthrough seizure. The patient saw Dr. Cristopher Peru on November 18. The daughter was getting her home and the patient developed 1 seizure while she was getting home, because of the seizure brought into the Emergency Room. The patient admitted to hospitalist service under observation status for breakthrough seizures. Dr. Cristopher Peru recommended to increase the Keppra to 750 b.i.d. He did not recommend an EEG or CAT scan. The patient placed in observation, did not have any ( further > seizures. We increased the Keppra to 750 b.i.d. The patient is given a prescription for increased dose of Keppra at 750 mg b.i.d. I have asked the patient's daughter to take her to Dr. Anselm Jungling appointment as scheduled.   2.  Hypomagnesemia, which is replaced.  3.  Hypertension. The patient's blood pressure has been ranging within normal range. Will continue her home medications.  4.  History of Alzheimer dementia. The patient's daughter says that since last discharge she was mostly in the wheelchair not doing much activity, so she wanted physical therapy evaluation. Physical therapy recommended short-term rehab but the patient's family chose to have home physical therapy and the patient has 24-hour caregivers at home all the time. The patient was followed with hospice at home. According to the care manager, the patient can have physical therapy services through hospice and we have arranged that.    DISCHARGE PHYSICAL EXAMINATION:  CARDIOVASCULAR: S1 and S2, right.  LUNGS: Clear to auscultation.  GENERAL: The patient was alert and oriented today. Denies any complaints.   ABDOMEN: Soft, nontender, nondistended. Bowel sounds present. VITAL SIGNS: At the time of discharge, heart rate 66, blood pressure 112/83, saturations 97% on room air, temperature 97.7.   PERTINENT LABORATORY DATA: White count is normal. Electrolytes were normal. Magnesium was 1.6, which was replaced. The patient's urine was clear without any infection. Urine cultures and blood cultures were showing no growth. Chest x-ray is negative for any pneumonia. EKG showed low-voltage complexes, sinus bradycardia, 53 beats per minute on admission.   TIME SPENT: More than 30 minutes.      ____________________________ Katha Hamming, MD  sk:at D: 12/05/2013 14:20:08 ET T: 12/05/2013 16:35:42 ET JOB#: [id number]  cc: Katha HammingSnehalatha Nimco Bivens, MD, <Dictator> Hemang K. Sherryll BurgerShah, MD Katha HammingSNEHALATHA Azarria Balint MD ELECTRONICALLY SIGNED 12/15/2013 18:09

## 2014-05-09 NOTE — H&P (Signed)
PATIENT NAME:  Audrey [name], Audrey [name] MR#:  [id number] DATE OF BIRTH:  Sep 07, 1930  PRIMARY CARE PHYSICIAN:  Dr. Merlinda Frederick  REFERRING PHYSICIAN:  Randon Goldsmith. Lord, MD    CHIEF COMPLAINT:  Seizure.  HISTORY OF PRESENT ILLNESS: The patient is an 81 year old Caucasian female with a chronic history of seizures on Keppra 500 mg p.o. q. 12 hours, is brought into the ED after she had an episode of seizure this morning. The patient was just recently admitted to the hospital on November 2 and was discharged on November 4 with a chief complaint of breakthrough seizure and urinary tract infection. The patient was started on quinolones and seen by Dr. Sherryll Burger, but at that time the breakthrough seizure was deemed to be from drug interaction with quinolones.  The patient was discharged home. Today, she went to follow up with her doctor and while she was coming out of the doctor's office, she had episode of breakthrough seizure. Dr. Sherryll Burger has recommended to increase her Keppra dose to 750 mg p.o. b.i.d. from 500 mg. According to the daughter, since she was discharged from the hospital on November 4, she is very weak and tired and almost bedbound. Usually patient gets out of the bed and takes care of her activities with some assistance, but now since the previous discharge, even with assistance, they are having a hard time to get her out of the bed.  As the patient is very weak and debilitated, they are requesting rehabilitation. The patient being demented, I was unable to get any history from the patient. The patient is hypotensive in the hospital with initial blood pressure at 70/40, but with 1 liter of fluid her blood pressure significantly came up and in the normal range.  PAST MEDICAL HISTORY: Hypertension, hyperlipidemia, chronic dementia, spinal stenosis, degenerative arthritis. History of seizures.  PAST SURGICAL HISTORY: Right cataract surgery. Skin cancer removal..   ALLERGIES: PENICILLIN.   PSYCHOSOCIAL HISTORY: Lives  alone. Caregivers take care of her. No history of smoking, alcohol, or illicit drug usage.   FAMILY HISTORY: Hypertension, diabetes mellitus, and coronary artery disease runs in her family  REVIEW OF SYSTEMS: Unobtainable as the patient is demented.   HOME MEDICATIONS: Keppra 500 mg p.o. b.i.d., Namenda 10 mg p.o. b.i.d., Toviaz 8 mg p.o. once daily, Vicodin 10/20 once daily, aspirin 81 mg  once daily, benazepril 20 mg once daily, Tessalon Perles as needed, hydrochlorothiazide 12.5 mg p.o. once daily, aspirin 81 mg once daily.   PHYSICAL EXAMINATION:  VITAL SIGNS: The initial blood pressure was at 76/54, after 1 liter bolus, 116/55. Temperature is afebrile. Pulse 60, respirations 20, pulse oximetry is 99% on room air. GENERAL APPEARANCE: Not in any acute distress, looks weak and debilitated. HEENT:  Normocephalic, atraumatic. Pupils are equally reacting to light and accommodation. No scleral icterus. No conjunctival injection. No sinus tenderness. Dry mucous membranes.  NECK: Supple. No JVD. No thyromegaly. Range of motion is intact.  LUNGS: Clear to auscultation bilaterally. No accessory muscle use and no anterior chest wall tenderness on palpation.  CARDIAC: S1 and S2 normal. Regular rate and rhythm. No murmurs. GASTROINTESTINAL: Soft. Bowel sounds are positive in all 4 quadrants. Nontender, nondistended. No hepatosplenomegaly. No masses felt.  NEUROLOGIC: Awake and alert, but oriented. Not following any verbal commands.  Does not answer any questions, but patient was able to tell her name is Audrey [name].  Could not recognize her daughter.  Sensory seemed to  intact.  Motor, could not elicit  EXTREMITIES: No edema. No  cyanosis. No clubbing.  SKIN: Warm to touch. Normal turgor. No rashes. No lesions.  MUSCULOSKELETAL: No joint effusion, tenderness, erythema.  PSYCHIATRIC: Mood and affect could not be elicited in view of advanced dementia.  LABORATORY AND IMAGING STUDIES:  CK total 22, CPK-MB less  than 0.05, troponin less than 0.02. WBC normal. Hemoglobin 11.4, hematocrit 34.4, platelets 296,000. Urinalysis, yellow in color, clear in appearance. Nitrites are negative. Leukocyte esterase is negative. BMP, anion gap is 5, glucose 109, BUN 23. Serum osmolality and calcium are normal. Chest x-ray, portable, no active disease. Mild cardiac enlargement and mild bronchitic and interstitial lung changes. A 12-lead EKG, sinus bradycardia, 53 beats per minute, normal PR and QRS interval.  No acute ST-T wave changes.   ASSESSMENT AND PLAN: An 81 year old Caucasian female brought into the Emergency Department  after she had an episode of breakthrough seizure at her doctor's office. She has a chronic history of dementia and has been very weak and almost bedbound since her previous discharge on November 4.   1.  Acute on chronic seizure. Neurologist has recommended to increase the Keppra dose from 500 to 750 mg p.o. b.i.d.  We will continue that and monitor her closely. Will get neurologic checks. EEG s not needed at this time. If the patient develops more episodes of seizures will consider neurology consult.  2.  Generalized weakness since previous admission with breakthrough seizure and sepsis. We will put in a physical therapy consult and the patient might be benefited with rehabilitation placement.  3.  Hypotension, probably from dehydration.  Improved significantly with intravenous fluids. We will hold her blood pressure medications tonight.  4.  Chronic history of dementia. Continue donepezil and Namenda.  5.  Hyperlipidemia. Resume her home medication.  6.  Hypertension. Currently, the patient being hypotensive, with borderline blood pressure, we will hold off on her blood pressure medications.  7.  We will provide gastrointestinal and deep vein thrombosis prophylaxis.   She is full code. Daughter is the medical power of attorney. Plan of care discussed with the patient's daughter at bedside. She  verbalized understanding of the plan.   TOTAL TIME SPENT ON THE ADMISSION: 45 minutes.    ____________________________ Ramonita LabAruna Shannen Vernon, MD ag:LT D: 12/03/2013 19:29:57 ET T: 12/03/2013 20:55:25 ET JOB#: [id number]  cc: Ramonita LabAruna Terion Hedman, MD, <Dictator> Ramonita LabARUNA Janavia Rottman MD ELECTRONICALLY SIGNED 12/12/2013 18:08

## 2014-05-09 NOTE — H&P (Signed)
PATIENT NAME:  Audrey [name], Audrey [name] MR#:  [id number] DATE OF BIRTH:  [birth year]  DATE OF ADMISSION:  11/17/2013  PRIMARY CARE PHYSICIAN: Elnita Maxwellheryl L. Lin GivensJeffries, MD  REFERRING PHYSICIAN: Sheran FavaMark R. Fanny BienQuale, MD  CHIEF COMPLAINT: Syncope.  HISTORY OF PRESENT ILLNESS: Audrey [name] is an 81 year old female with a history of dementia, hypertension, hyperlipidemia who has been experiencing episodes of unresponsiveness, stare looking, lasting about 1 to 2 minutes. Concerning this, the patient was evaluated by neurology during the last admission and was initiated on Keppra. The patient was recently treated for a urinary tract infection, completed the antibiotics about a week back. The patient has been having diarrhea for the last 2 days. Today the patient ate her dinner and had an episode of altered mental status with a stare look, open mouth. This episode lasted for about 3 to 4 minutes. Again had another episode. Concerning this, EMS was called and was brought to the Emergency Department. Per her caregiver, the patient takes her Keppra without missing. On workup in the Emergency Department, the patient is found to have urinary tract infection.   PAST MEDICAL HISTORY: 1.  Hypertension. 2.  Hyperlipidemia. 3.  Spinal stenosis. 4.  Advanced dementia. 5.  Degenerative arthritis.  PAST SURGICAL HISTORY: 1.  Right cataract surgery. 2.  Skin cancer removal.  ALLERGIES: PENICILLIN.   HOME MEDICATIONS: 1.  Vytorin 10/20 once a day. 2.  Toviaz 8 mg once a day. 3.  [facility]' Colon Health.  4.  Omeprazole 20 mg once a day. 5.  Nitrofurantoin 1 capsule once a day. 6.  Namenda 10 mg 2 times a day. 7.  Keppra 500 mg 2 times a day. 8.  Hydrochlorothiazide 12.5 mg once a day. 9.  Donepezil 10 mg once a day. 10.  Benazepril 20 mg once a day. 11.  Aspirin 81 mg once a day.   SOCIAL HISTORY: The patient is a widow, lives at home with a 24-hour caregiver.  FAMILY HISTORY: Significant for hypertension, diabetes mellitus,  and coronary artery disease.  REVIEW OF SYSTEMS: Unable to update. Currently the patient suffering from advanced dementia.   PHYSICAL EXAMINATION: GENERAL: This is a well-built, well-nourished, age-appropriate female lying down in the bed not in distress. VITAL SIGNS: Temperature 96.1, pulse 65, blood pressure 132/66, respiratory rate of 18, oxygen saturation is 97% on room air. HEENT: Head normocephalic, atraumatic. There is no scleral icterus. Conjunctivae normal. Pupils equal and react to light. Mucous membranes moist. No pharyngeal erythema. NECK: Supple. No lymphadenopathy. No JVD. No carotid bruit. No thyromegaly.  CHEST: No focal tenderness. LUNGS: Bilaterally clear to auscultation. HEART: S1, S2 regular. No murmurs are heard. ABDOMEN: Bowel sounds plus. Soft, nontender, nondistended. No hepatosplenomegaly. EXTREMITIES: No pedal edema. Pulses 2+.  NEUROLOGIC: Patient is oriented to self but not to person, and time. Cranial nerves II-XII are intact. Could not examined the motor and sensory. SKIN: No rash or lesions. MUSCULOSKELETAL: Could not examine.   LABORATORY DATA: UA with 3+ leukocyte esterase, WBC of 404. The rest of all the values are within normal limits. CT, head, without contrast: No acute intracranial abnormality. CBC, CMP are completely within normal limits.  ASSESSMENT AND PLAN: Ms. Audrey [name] is an 81 year old female who comes with: 1. Syncope. The patient had extensive workup done in the past. The workup is negative. Continue the Keppra for now. 2.  Urinary tract infection. Urine cultures have been sent. Keep the patient on rocephin.  3.  Diarrhea. Check the Clostridium difficile toxin as the patient recently  used the antibiotics. 4.  Advanced dementia, expected to get worse. 5.  Hypertension. Hold the hydrochlorothiazide as the patient was having diarrhea. Continue with the Benazepril. 6.  Keep the patient on deep venous thrombosis prophylaxis with Lovenox.  TIME SPENT:  50 minutes.    ___________________________ Susa Griffins, MD pv:ST [name]: 11/17/2013 23:20:00 ET T: 11/18/2013 01:03:43 ET JOB#: [id number]  cc: Susa Griffins, MD, <Dictator> Susa Griffins MD ELECTRONICALLY SIGNED 11/26/2013 21:09

## 2014-05-17 NOTE — H&P (Signed)
PATIENT NAME:  Lynnette CaffeyKARNES, Christeen D MR#:  [id number] DATE OF BIRTH:  12-08-1930  DATE OF ADMISSION:  02/02/2014  REFERRING PHYSICIAN:  Gladstone Pihavid Schaevitz, MD  PRIMARY CARE PHYSICIAN:  Maurine MinisterMiriam McLaughlin, MD  CHIEF COMPLAINT:  Seizures.   HISTORY OF PRESENT ILLNESS:  This is an 81 year old Caucasian female with a history of seizure disorder as well as dementia, presenting after multiple seizures and an acute episode of seizure activity today witnessed by caretaker, who states full body shaking followed by an unresponsive state and eventually returned back to normal; however, this was followed by a second episode. Upon EMS arrival, she still remained unresponsive. Thus, they gave her Narcan with relatively no effect. Per their report, they noticed episodes of "V. tach" on telemetry, and she was brought to the hospital for further workup and evaluation. The patient is now back to baseline. She is unable to provide any real meaningful information given mental status and medical condition.   PAST MEDICAL HISTORY:  Includes seizure disorder; hyperlipidemia, unspecified; hypertension, essential; as well as dementia.   SOCIAL HISTORY:  No alcohol, tobacco, or drug usage. Relatively immobile at baseline and has caregivers at home.   FAMILY HISTORY:  Positive for hypertension, diabetes, and coronary artery disease.   ALLERGIES:  PENICILLIN.   HOME MEDICATIONS:  Include aspirin 81 mg p.o. daily, benazepril 20 mg p.o. daily, carbamazepine 100 mg p.o. b.i.d., Vytorin 10/20 mg p.o. daily, donepezil 10 mg p.o. at bedtime, hydrochlorothiazide 12.5 mg p.o. daily, Colace 150 mg p.o. every other day as needed for constipation, Namenda 10 mg p.o. b.i.d., probiotic 1 capsule p.o. daily, Prilosec 20 mg p.o. daily, Macrobid 100 mg p.o. daily, Toviaz 8 mg p.o. daily, multivitamin 1 tab p.o. daily.   PHYSICAL EXAMINATION: VITAL SIGNS:  Temperature 97.9, heart rate 68, respirations 15, blood pressure 93/40, saturating 96% on room  air, current blood pressure 137/74.  GENERAL:  Weak, frail-appearing Caucasian female currently in no acute distress.  HEAD:  Normocephalic, atraumatic.  EYES:  Pupils are equal, round, and reactive to light. Extraocular muscles are intact. No scleral icterus.  MOUTH:  Dry mucosal membranes. Dentition is intact. No abscess noted.  EARS, NOSE, AND THROAT:  Clear without exudates. No external lesions.  NECK:  Supple. No thyromegaly. No nodules. No JVD.  PULMONARY:  Clear to auscultation bilaterally without wheezes, rales, or rhonchi. No use of accessory muscles. Good respiratory effort. CHEST:  Nontender to palpation.  CARDIOVASCULAR:  S1 and S2, regular rate and rhythm. No murmurs, rubs, or gallops. No edema. Pedal pulses are 2+ bilaterally.  GASTROINTESTINAL:  Soft, nontender, nondistended. No masses. Positive bowel sounds. No hepatosplenomegaly. MUSCULOSKELETAL:  No swelling, clubbing, or edema. Range of motion is full in all extremities. NEUROLOGIC:  Unable to fully assess given the patient's mental status and medical condition. She does have some difficulty following basics on command, which is baseline per daughter and caregiver who are at bedside. She, however, moves all 4 extremities without any difficulty. SKIN:  No ulceration, lesions, rashes, or cyanosis. Skin is warm and dry. Turgor is intact.  PSYCHIATRIC:  Mood and affect are within normal range. The patient is awake, alert, and oriented x 3. Insight and judgment are intact.   LABORATORY DATA:  Sodium is 133, potassium 4.4, chloride 99, bicarbonate 28, BUN 17, creatinine 0.96, glucose 115. LFTs:  Albumin 3, bilirubin 0.1, alkaline phosphatase 125, AST 54. Troponin is less than 0.02. Tegretol is 5.7. WBC is 9.3, hemoglobin 12, platelets 283,000. Urinalysis is negative for  evidence of infection. Chest x-ray performed reveals no acute findings.   ASSESSMENT AND PLAN:  This is an 81 year old Caucasian female with history of seizure  disorder, presenting after seizure activity with questionable "V. tach" noticed on telemetry per EMS.   1.  Seizure activity. Place on telemetry. We will consult neurology. She follows with Dr. Sherryll Burger. Continue with Tegretol. She already has been taken off of Keppra. Neurological checks q. 4 hours.  2.  Hypertension. Continue with benazepril.  3.  Gastroesophageal reflux disease. Proton pump inhibitor therapy. 4.  Dementia. Continue with Namenda and Aricept.   CODE STATUS:  The patient is a full code.   TIME SPENT:  45 minutes.   Of note, during my examination, as the patient is somewhat shaking, telemetry is reading ventricular tachycardia; however, she is clearly in normal sinus rhythm.    ____________________________ Cletis Athens. Phyllistine Domingos, MD dkh:nb D: 02/02/2014 00:41:22 ET T: 02/02/2014 01:41:35 ET JOB#: [id number]  cc: Cletis Athens. Jermy Couper, MD, <Dictator> Marvyn Torrez Synetta Shadow MD ELECTRONICALLY SIGNED 02/02/2014 20:40

## 2014-05-17 NOTE — Consult Note (Signed)
Referring Physician:  Lytle Butte   Primary Care Physician:  [facility], [address], [place], [place] [address], [place] [phone]  Reason for Consult: Admit Date: 02-Feb-2014  Chief Complaint: seizures  Reason for Consult: seizure   History of Present Illness: History of Present Illness:   81 yo RHD F presents to [facility] secondary to recurrent seizures.  Pt sees Dr. Manuella Ghazi as an outpatient but was previously on [place] but this caused a lot of drowsiness so she was recently switched to Tegretol which did not cause as much.  She has been having like 3-4 seizures per month.  At baseline she is communicative but requires assistance for all ADLs and has a caregiver.  She has severe dementia.  ROS:  Review of Systems   unobtainalbe secondary to dementia  Past Medical/Surgical Hx:  Seizures:   HTN:   Hypercholesterolemia:   Dementia:   Hysterectomy - Partial:   Carpal Tunnel Release:   Cataract Extraction:   Past Medical/ Surgical Hx:  Past Medical History personally reviewed by me as above   Past Surgical History personally reviewed by me as above   Home Medications: Medication Instructions Last Modified Date/Time  benzonatate 100 mg oral capsule 1 cap(s) orally 3 times a day, As Needed for cough 18-Jan-16 01:18  donepezil 10 mg oral tablet 1 tab(s) orally once a day (at bedtime) 18-Jan-16 01:18  Namenda 10 mg oral tablet 1 tab(s) orally 2 times a day 18-Jan-16 01:18  aspirin 81 mg oral tablet, chewable 1 tab(s) orally once a day (in the morning) 18-Jan-16 01:18  benazepril 20 mg oral tablet 1 tab(s) orally once a day (in the morning) 18-Jan-16 01:18  Centrum Silver Antioxidant Multiple Vitamins and Minerals oral tablet 1 tab(s) orally once a day (in the morning) 18-Jan-16 01:18  hydrochlorothiazide 12.5 mg oral tablet 1 tab(s) orally once a day (in the morning) 18-Jan-16 01:18  Ocuvite Lutein Antioxidant Multiple Vitamins and Minerals oral  capsule 1 cap(s) orally once a day (in the morning) 18-Jan-16 01:18  omeprazole 20 mg oral delayed release capsule 1 cap(s) orally once a day (in the morning) 18-Jan-16 01:18  [place] - oral capsule 1 cap(s) orally once a day (in the morning) 18-Jan-16 01:18  Toviaz 8 mg oral tablet, extended release 1 tab(s) orally once a day (in the morning) 18-Jan-16 01:18  Vytorin 10 mg-20 mg oral tablet 1 tab(s) orally once a day (at bedtime) 18-Jan-16 01:18  carBAMazepine 100 mg oral capsule, extended release 1 cap(s) orally 2 times a day 18-Jan-16 01:18  Colace 150 milligram(s) orally every other day 18-Jan-16 01:18  nitrofurantoin macrocrystals 100 mg oral capsule 1 cap(s) orally once a day 18-Jan-16 01:18   Allergies:  PCN: Unknown  Allergies:  Allergies PCN   Social/Family History: Employment Status: disabled  Lives With: children  Living Arrangements: house  Social History: no tob, no EtOh, no illicits  Family History: no seizures, no stroke   Vital Signs: **Vital Signs.:   18-Jan-16 05:49  Vital Signs Type Routine  Temperature Temperature (F) 98.2  Celsius 36.7  Temperature Source oral  Pulse Pulse 70  Respirations Respirations 20  Systolic BP Systolic BP 222  Diastolic BP (mmHg) Diastolic BP (mmHg) 81  Mean BP 95  Pulse Ox % Pulse Ox % 97  Pulse Ox Activity Level  At rest  Oxygen Delivery Room Air/ 21 %   Physical Exam: General: obese, NAD  HEENT: normocephalic, sclera nonicteric, oropharynx  clear  Neck: supple, no JVD, no bruits  Chest: CTA B, no wheezing, good movement  Cardiac: RRR, no murmurs, no edema, 2+ pulses  Extremities: no C/C/E, FROM   Neurologic Exam: Mental Status: sleepy but oriented only to person, follows some simple commands but limited verbal.  Family states she is at baseline  Cranial Nerves: PERRLA, EOMI, nl VF to threat, face symmetric, tongue midline  Motor Exam: moves B equally, nl tone  Deep Tendon Reflexes: 1+/4 B, plantars  downgoing  Sensory Exam: grimaces to pain in all 4 ext  Coordination: untestable   Lab Results: Hepatic:  17-Jan-16 22:02   Bilirubin, Total  0.1  Alkaline Phosphatase  125 (46-116 NOTE: New Reference Range 08/05/13)  SGPT (ALT) 46 (14-63 NOTE: New Reference Range 08/05/13)  SGOT (AST)  54  Total Protein, Serum 7.7  Albumin, Serum  3.0  TDMs:  17-Jan-16 22:02   Tegretol Carbamazepine, Serum, Total 5.7 (Result(s) reported on 01 Feb 2014 at 11:11PM.)  Routine Chem:  17-Jan-16 22:02   Glucose, Serum  115  BUN 17  Creatinine (comp) 0.96  Sodium, Serum  133  Potassium, Serum 4.4  Chloride, Serum 99  CO2, Serum 28  Calcium (Total), Serum 9.7  Osmolality (calc) 269  eGFR (African American) >60  eGFR (Non-African American)  59 (eGFR values <6m/min/1.73 m2 may be an indication of chronic kidney disease (CKD). Calculated eGFR, using the MRDR Study equation, is useful in  patients with stable renal function. The eGFR calculation will not be reliable in acutely ill patients when serum creatinine is changing rapidly. It is not useful in patients on dialysis. The eGFR calculation may not be applicable to patients at the low and high extremes of body sizes, pregnant women, and vegetarians.)  Anion Gap  6  Cardiac:  18-Jan-16 05:54   Troponin I < 0.02 (0.00-0.05 0.05 ng/mL or less: NEGATIVE  Repeat testing in 3-6 hrs  if clinically indicated. >0.05 ng/mL: POTENTIAL  MYOCARDIAL INJURY. Repeat  testing in 3-6 hrs if  clinically indicated. NOTE: An increase or decrease  of 30% or more on serial  testing suggests a  clinically important change)  Routine UA:  17-Jan-16 22:01   Color (UA) Yellow  Clarity (UA) Clear  Glucose (UA) Negative  Bilirubin (UA) Negative  Ketones (UA) Negative  Specific Gravity (UA) 1.015  Blood (UA) Negative  pH (UA) 7.0  Protein (UA) Negative  Nitrite (UA) Negative  Leukocyte Esterase (UA) Negative (Result(s) reported on 01 Feb 2014 at  11:01PM.)  RBC (UA) 2 /HPF  WBC (UA) 1 /HPF  Bacteria (UA) NONE SEEN  Epithelial Cells (UA) <1 /HPF  Mucous (UA) PRESENT  Hyaline Cast (UA) 11 /LPF (Result(s) reported on 01 Feb 2014 at 11:01PM.)  Routine Hem:  17-Jan-16 22:02   WBC (CBC) 9.3  RBC (CBC)  3.77  Hemoglobin (CBC) 12.0  Hematocrit (CBC) 36.0  Platelet Count (CBC) 283 (Result(s) reported on 01 Feb 2014 at 10:49PM.)  MCV 96  MCH 31.8  MCHC 33.3  RDW 13.3   Impression/Recommendations: Recommendations:   prior notes reviewed by me reviewed by me   Epilepsy-  this is not controlled as pt is only on one medication which is not therapeutically too high Dementia-  end-stage increase Tegretol to 1044mTID start Lamictal 253maily x 2 weeks, then 83m17mily x 1 week, then 83mg69m no need for EEG at this time ok to d/c home, please have pt f/u with Dr. Shah Manuella Ghazi-6 weeks  Electronic Signatures: Jamison Neighbor (MD)  (Signed 18-Jan-16 12:12)  Authored: REFERRING PHYSICIAN, Primary Care Physician, Consult, History of Present Illness, Review of Systems, PAST MEDICAL/SURGICAL HISTORY, HOME MEDICATIONS, ALLERGIES, Social/Family History, NURSING VITAL SIGNS, Physical Exam-, LAB RESULTS, Recommendations   Last Updated: 18-Jan-16 12:12 by Jamison Neighbor (MD)

## 2014-05-17 NOTE — Discharge Summary (Signed)
PATIENT NAME:  Audrey CaffeyKARNES, Clemma D MR#:  [id number] DATE OF BIRTH:  [birth year]  DATE OF ADMISSION:  04/03/2014 DATE OF DISCHARGE:  04/11/2014  ADMITTING DIAGNOSIS:  Pneumonia.   DISCHARGE DIAGNOSES:  1.  Sepsis. 2.  Left lower lobe pneumonia due to unknown etiologic agent at present, suspected aspiration pneumonitis.  3.  Metabolic encephalopathy.  4.  Hypotension resolved on intravenous fluids. 5.  History of seizure disorder, dementia, hypertension, hyperlipidemia, overactive bladder.   DISCHARGE CONDITION: Poor.   DISCHARGE MEDICATIONS:  1.  The patient is being discharged to hospice at home with lamotrigine 25 mg twice daily. 2.  Carbamazepine 100 mg twice daily. 3.  Nystatin topical powder twice daily as needed. 4.  Lorazepam 0.5 mg 1 to 2 tablets every 2 to 4 hours as needed. 5.  Morphine 21 mg in 1 mL oral concentrate 0.5 mL every 1 to 2 hours.  6.  Zofran ODT 4 mg every 6 hours as needed.  7.  The patient is not to take benzonatate, Namenda, Centrum Silver multivitamins, omeprazole, [employer]' Colon Health, aspirin, Colace, donepezil, nitrofurantoin, Ocuvite, Toviaz, Vytorin, benazepril.   FOLLOWUP APPOINTMENTS: None.   DIET: As tolerated, preferably pureed with honey thick liquids with strict aspiration precautions.   CONSULTANTS: Care management, social work, Mr. Ivin BootyJoshua Borders nurse practitioner for palliative care and Dr. Harvie JuniorPhifer palliative care physician.   RADIOLOGIC STUDIES:  Chest x-ray portable single view 04/03/2014 revealed mild interstitial edema pattern, mild cardiomegaly, left lung base poorly evaluated, no significant change since prior study. CT of head without contrast 04/03/2014 revealing no evidence of acute intracranial abnormality, stable ventriculomegaly raising the possibility of normal pressure hydrocephalus, atrophy with small vessel ischemic changes, stable encephalomalacia in the anterior temporal lobes. A repeated chest x-ray portable single view 04/04/2014  revealed bronchitic changes superimposed on chronic interstitial disease. Repeated chest x-ray portable single view 04/05/2014 revealed a left lateral lung base opacity which may reflect pneumonia or atelectasis.  No evidence of cardiopulmonary disease.  Chest x-ray portal single view 03/27/ 2016 showed worsening capacity in left lung base suggestive of progressive pneumonia, possible small associated left pleural effusion. CT of the head without contrast on 03/272016 revealed stable ventriculomegaly, potentially ex vacuo or possibly related to normal pressure hydrocephalus, periventricular white matter and corona radiata hypodensities, stable chronic ischemic of microvascular white matter disease. No intracranial hemorrhage, mass lesion or criteria identified, acute on chronic paranasal sinusitis. Repeated chest x-ray portable single view 04/10/2014 showed increasing consolidation in the left lung base.   HOSPITAL COURSE:  The patient is an 81 year old Caucasian female with history of dementia, seizure, hyperlipidemia, hypertension who presents to the hospital with unresponsiveness.  Please refer to Dr. Mathews RobinsonsWieting's admission note on 04/03/2014. On arrival to the hospital, the patient's temperature was 98.1, pulse was 85, respiration was 19, blood pressure 120/44, saturation was 100% on oxygen therapy. Physical exam revealed coarse breath sounds bilaterally on lung exam, but otherwise unremarkable study. The patient was grunting with sternal rub.  The patient's lab data done on arrival to the hospital revealed elevated glucose level to 100, sodium 130.  Otherwise BMP was unremarkable. The patient's liver enzymes were unremarkable. Cardiac enzymes, troponin was less than 0.03. White blood cell count was normal at 3.9, hemoglobin was 11.1, platelet count was 216,000. Blood cultures taken on 04/03/2014 showed no growth. Influenza test was also negative. Urinalysis revealed 24 red blood cells, 9 white blood cells, 3+  bacteria, positive for nitrites and negative for leukocyte esterase, 2+ epithelial cells,  white blood cell clumps were present as well as mucous. EKG showed normal sinus rhythm at 72 beats per minute, low voltage QRS, nonspecific ST-T changes, and no significant change since prior EKG.  The patient was admitted to the hospital for further evaluation because of concerns of urinary tract infection. The patient was initiated on broad-spectrum antibiotic therapy; however, her condition did not improve and progressively got worse.  Chest x-rays revealed left-sided pneumonia. Although the patient's mental status somewhat improved, she continued to deteriorate overall in regards to lung exam and radiologically.  Her family discussed her care with palliative care and decided on a hospice home.  Discharged today on 04/11/2014. On the day of discharge, she did not complain of any significant discomfort or shortness of breath. She had some guarding on her lung exam in her upper airways.  The vital signs were stable. Temperature was 98.6, pulse was 70, respirations were 18, blood pressure ranging from 81 to 160 systolic and 40s to 80s diastolic. Oxygen saturations were 98% on 2 liters of oxygen through nasal cannula at rest.   TIME SPENT: 40 minutes on the patient.    ____________________________ Katharina Caper, MD rv:sp D: 04/11/2014 17:44:25 ET T: 04/12/2014 12:06:02 ET JOB#: [id number]  cc: Katharina Caper, MD, <Dictator> Rovena Hearld MD ELECTRONICALLY SIGNED 04/23/2014 17:13

## 2014-05-17 NOTE — H&P (Signed)
PATIENT NAME:  Audrey CaffeyKARNES, Estalene D MR#:  [id number] DATE OF BIRTH:  Jul 23, 1930  DATE OF ADMISSION:  04/03/2014  DATE OF BIRTH: Lora PaulaMimi McLaughlin, PA-C.     CHIEF COMPLAINT: Brought in with unresponsiveness.   HISTORY OF PRESENT ILLNESS: This is an 81 year old female who is followed by hospice for Alzheimer dementia. She had a cough earlier this week, it was dry and then seemed to become more thick and almost productive, but not bringing anything up. They called the physician's office and was started on Claritin, she took her first dose this morning. She started not communicating and not eating very well, and she is just grunting now.  She had a hard time breathing today. Last Monday she did have a seizure. Normally she eats very well and able to answer some simple questions as per the daughter, but at this time unable to give any history. History obtained from the daughter who is at the bedside.   PAST MEDICAL HISTORY: Alzheimer dementia, seizure, hyperlipidemia, hypertension, they have been trying to cut back on some medications at this point.   PAST SURGICAL HISTORY: Hysterectomy, cataracts, carpal tunnel.   ALLERGIES: PENICILLIN.   MEDICATIONS: Include aspirin 81 mg daily, benazepril 20 mg half tablet in the morning, Tessalon Perles 100 mg 3 times a day as needed for cough, carbamazepine 100 mg extended release twice a day, Centrum Silver 1 tablet in the morning, Colace 15 mL once a day in the morning, benazepril 10 mg once a day in the morning, lamotrigine 25 mg twice a day, Namenda 10 mg twice a day, nitrofurantoin 100 mg once a day, Ocuvite 1 tablet daily, omeprazole 20 mg daily, [employer] Colon Health 1 capsule daily, Toviaz 8 mg in the evening, Vytorin 10/20 one tablet in the evening.   SOCIAL HISTORY: Living at home with 24/7 care, under hospice care also. Quit smoking at age 81. No alcohol. No drug use. Was a homemaker.   FAMILY HISTORY: Father died of an MI at 5862. Mother died at 6682 of a brain  aneurysm.   REVIEW OF SYSTEMS: Unable to obtain secondary to altered level of consciousness.  PHYSICAL EXAMINATION:  VITAL SIGNS: Temperature 98.1, pulse 85, respirations 19, blood pressure 120/44, pulse oximetry 100% on oxygen.  GENERAL: No respiratory distress.  EYES: Conjunctivae and lids normal. Pupils pinpoint. Unable to test extraocular muscles.  EARS, NOSE, MOUTH, AND THROAT:  Tympanic membranes, no erythema. Nasal mucosa, no erythema. Unable to examine mouth, did not open her mouth, clenched her teeth.  NECK: No JVD. No bruits. No lymphadenopathy. No thyromegaly. No thyroid nodules palpated.  RESPIRATORY: Lungs, coarse breath sounds bilaterally. No rhonchi, rales, or wheeze heard.  CARDIOVASCULAR: S1, S2 soft. No gallops, rubs, or murmurs heard. Carotid upstroke 2 + bilaterally. No bruits.  EXTREMITIES: Dorsalis pedis pulses 2 + bilaterally. No edema of the lower extremity.  ABDOMEN: Soft, nontender. No organomegaly/splenomegaly. Normoactive bowel sounds. No masses felt.  LYMPHATIC: No lymph nodes in the neck.  MUSCULOSKELETAL: No clubbing, edema, or cyanosis.  SKIN: No rashes or ulcers seen.  NEUROLOGIC: The patient grunted with sternal rub, tried to grunt when I asked her a question, did resist me moving her right arm, right arm very stiff.  PSYCHIATRIC: Unable to test at this point.   LABORATORY AND RADIOLOGICAL DATA: Chest x-ray, mild interstitial edema pattern, left lung base poorly evaluated. Glucose 100, BUN 20, creatinine 0.88, sodium 130, potassium 3.7, chloride 94, CO2 of 30, calcium 9.7. Total bilirubin 0.2, alkaline phosphatase 87,  ALT 28, AST 40, total protein 7.1, albumin 3.5. White blood cell count 3.9, H and H 11.1 and 33.6, platelet count of 216,000. Troponin negative. EKG, normal sinus rhythm, 74 beats per minute, no acute ST-T wave changes.   ASSESSMENT AND PLAN:  1.  Acute encephalopathy, decreased level of consciousness. As per the daughter normally she is able to  answer some simple questions and usually eats well. She has not been able to do any of that at this point. Possible infection, did have a cough, but chest x-ray was negative. Urinalysis showed positive nitrites. I will give Rocephin and Zithromax and repeat a chest x-ray in the a.m., send off cultures. Could also be a CVA. We will give aspirin rectally, get a CAT scan of the head. Will also get a swallow evaluation. This could also be dehydration, we will give IV fluid hydration.  2.  Possible urinary tract infection, with cough. We will repeat a chest x-ray after hydration tomorrow. We will continue antibiotics, Rocephin and Zithromax, and follow up cultures.  3.  Hyponatremia. We will give IV fluid hydration and check a sodium level tomorrow. 4.  Dementia with behavioral disturbance at this point, likely secondary to infection. 5.  Seizure disorder. We will try to give oral medications if possible. I do not think this is an active seizure. Her 24/7 care did not see any seizure-like activity.  6.  Hyperlipidemia.  7.  Hypertension. Blood pressure all right at this point. We will hold off on all oral medications until the patient is able to swallow a little bit better.   TIME SPENT ON ADMISSION: 50 minutes.   CODE STATUS: The patient is a DNR.     ____________________________ Herschell Dimes. Renae Gloss, MD rjw:bu D: 04/03/2014 19:55:10 ET T: 04/03/2014 21:18:15 ET JOB#: [id number]  cc: Herschell Dimes. Renae Gloss, MD, <Dictator> Miriam "[name]" [place], [place] Salley Scarlet MD ELECTRONICALLY SIGNED 04/05/2014 13:29

## 2014-05-17 NOTE — Discharge Summary (Signed)
PATIENT NAME:  Audrey [name], Audrey [name] MR#:  [id number] DATE OF BIRTH:  21-Jul-1930  DATE OF ADMISSION:  02/02/2014 DATE OF DISCHARGE:  02/02/2014  PRIMARY CARE PHYSICIAN: Miriam "[name]" Merlinda FrederickMcLaughlin, PA-C  DISCHARGE DIAGNOSES: Seizure, hypertension, dementia.   CONDITION: Stable.   CODE STATUS: Full code.   HOME MEDICATIONS: Please refer to the medication reconciliation list.   DIET: Low-sodium diet.   ACTIVITY: As tolerated.   FOLLOWUP CARE: Follow with PCP within 1 to 2 weeks. Follow up with Dr. Clelia CroftShaw, neurologist, within 4 to 6 weeks.   REASON FOR ADMISSION: Seizures.  HOSPITAL COURSE: The patient is an 81 year old Caucasian female with a history of seizure disorder, dementia, was sent to ED due to unresponsiveness and body shaking, witnessed by caretaker, who stated whole body shaking followed by unresponsive state, and eventually back to normal. For detailed history and physical examination, please refer to the admission note dictated by Dr. Clint GuyHower. On the admission date, the patient's laboratory data was unremarkable. Chest x-ray reveals no acute findings. The patient had no seizure activity after admission. Dr. Katrinka BlazingSmith, neurologist, saw the patient and suggested increased Tegretol to 100 mg t.i.[name]. He also suggested Lamictal 25 mg p.o. daily for 2 weeks, then increased to 50 mg p.o. daily for 1 week, then change to b.i.[name]. The patient is demented, but no seizure activity. The patient's vital signs are stable. She is clinically stable and will be discharged to home today. I discussed the patient's discharge plan with the patient's daughter, nurse, case manager, and Dr. Katrinka BlazingSmith.   TIME SPENT: About 38 minutes.  ____________________________ Shaune PollackQing Christophe Rising, MD qc:ap [name]: 02/02/2014 15:14:21 ET T: 02/02/2014 15:56:47 ET JOB#: [id number]  cc: Shaune PollackQing Yamilett Anastos, MD, <Dictator> Shaune PollackQING Shalom Ware MD ELECTRONICALLY SIGNED 02/02/2014 17:20

## 2014-05-17 DEATH — deceased

## 2015-08-31 IMAGING — CR DG CHEST 1V PORT
1 series · 1 of 1 positions shown · non-contrast
Comparison: 11/17/2013

CLINICAL DATA: Initial encounter for seizure today, personal
history of seizures

EXAM:
PORTABLE CHEST - 1 VIEW

[ap]
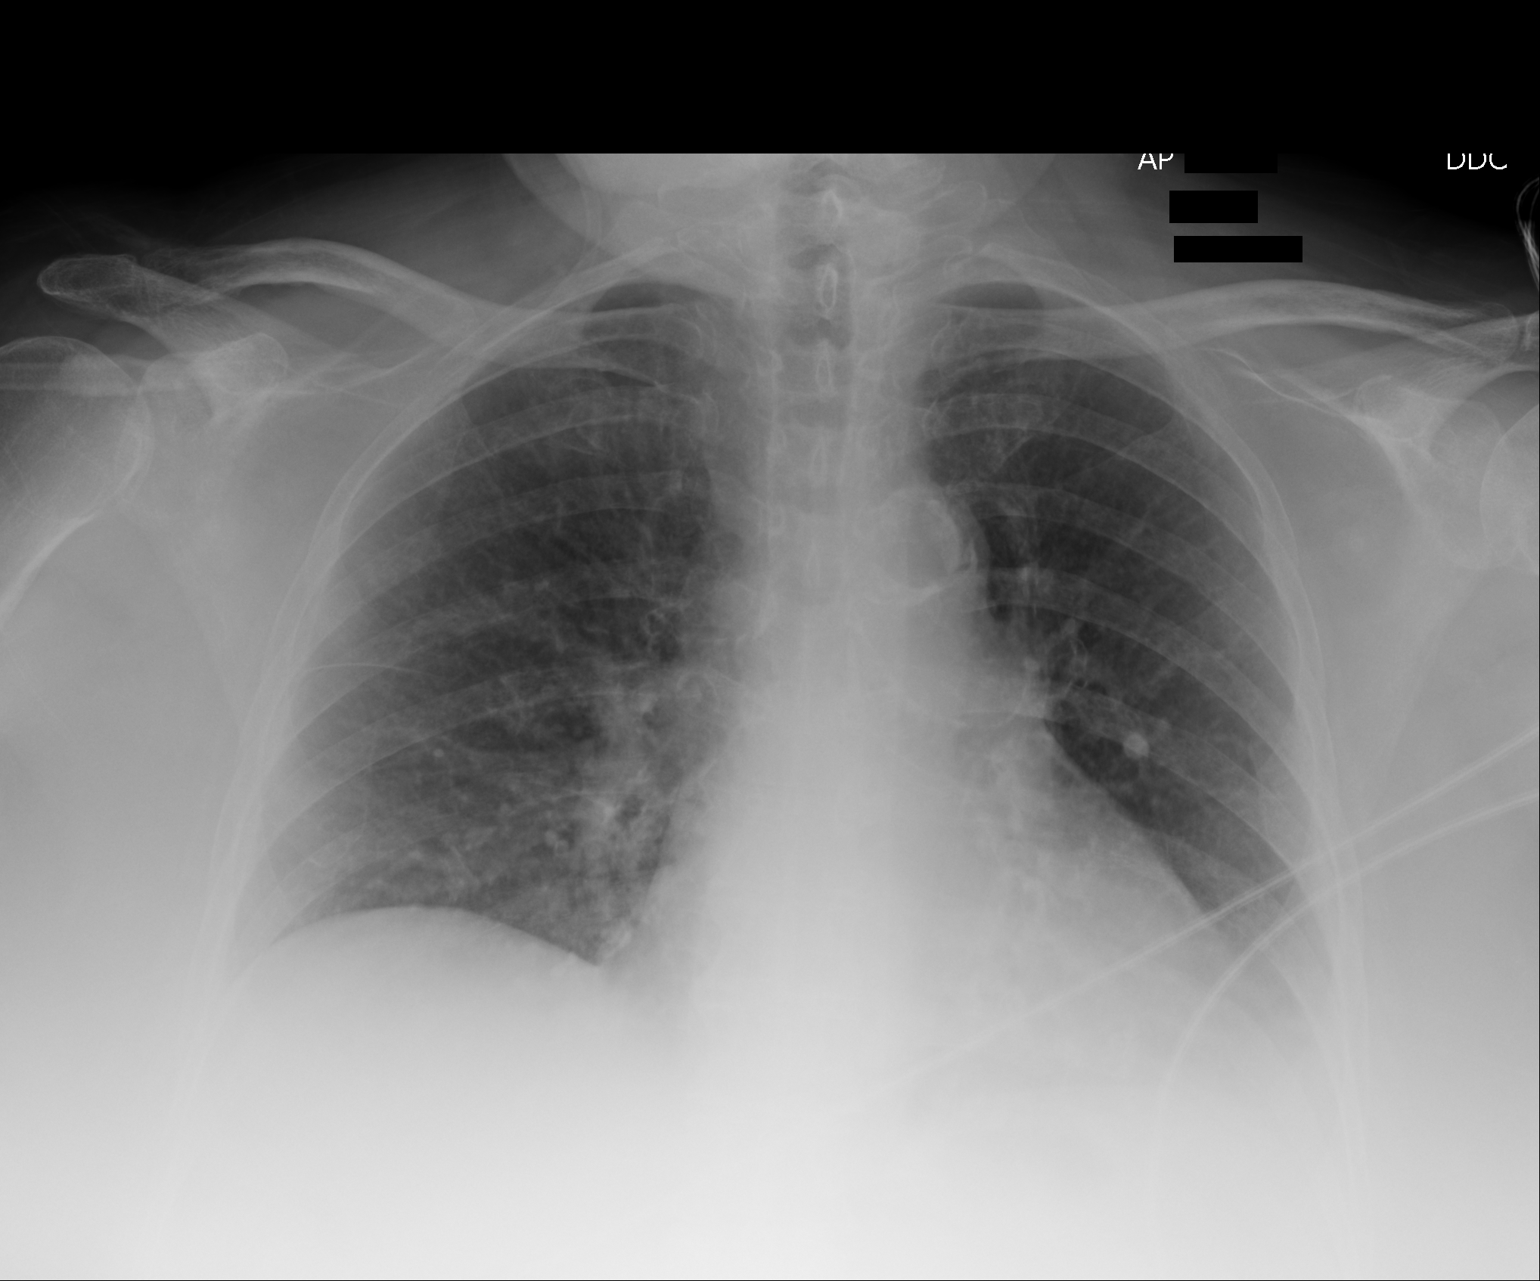

[1 of 1 positions shown; findings below may reference images not displayed]

FINDINGS: Stable mild cardiac enlargement and aortic calcification. Mild
vascular congestion. Mild perihilar bronchitic change bilaterally.
No consolidation or effusion. Stable mild background interstitial
change diffusely.
IMPRESSION: No active disease. Mild cardiac enlargement and mild bronchitic and
interstitial lung change.

## 2015-10-30 IMAGING — CR DG CHEST 1V PORT
1 series · 1 of 1 positions shown · non-contrast
Comparison: 12/03/2013

CLINICAL DATA: Altered mental status.

EXAM:
PORTABLE CHEST - 1 VIEW

[ap]
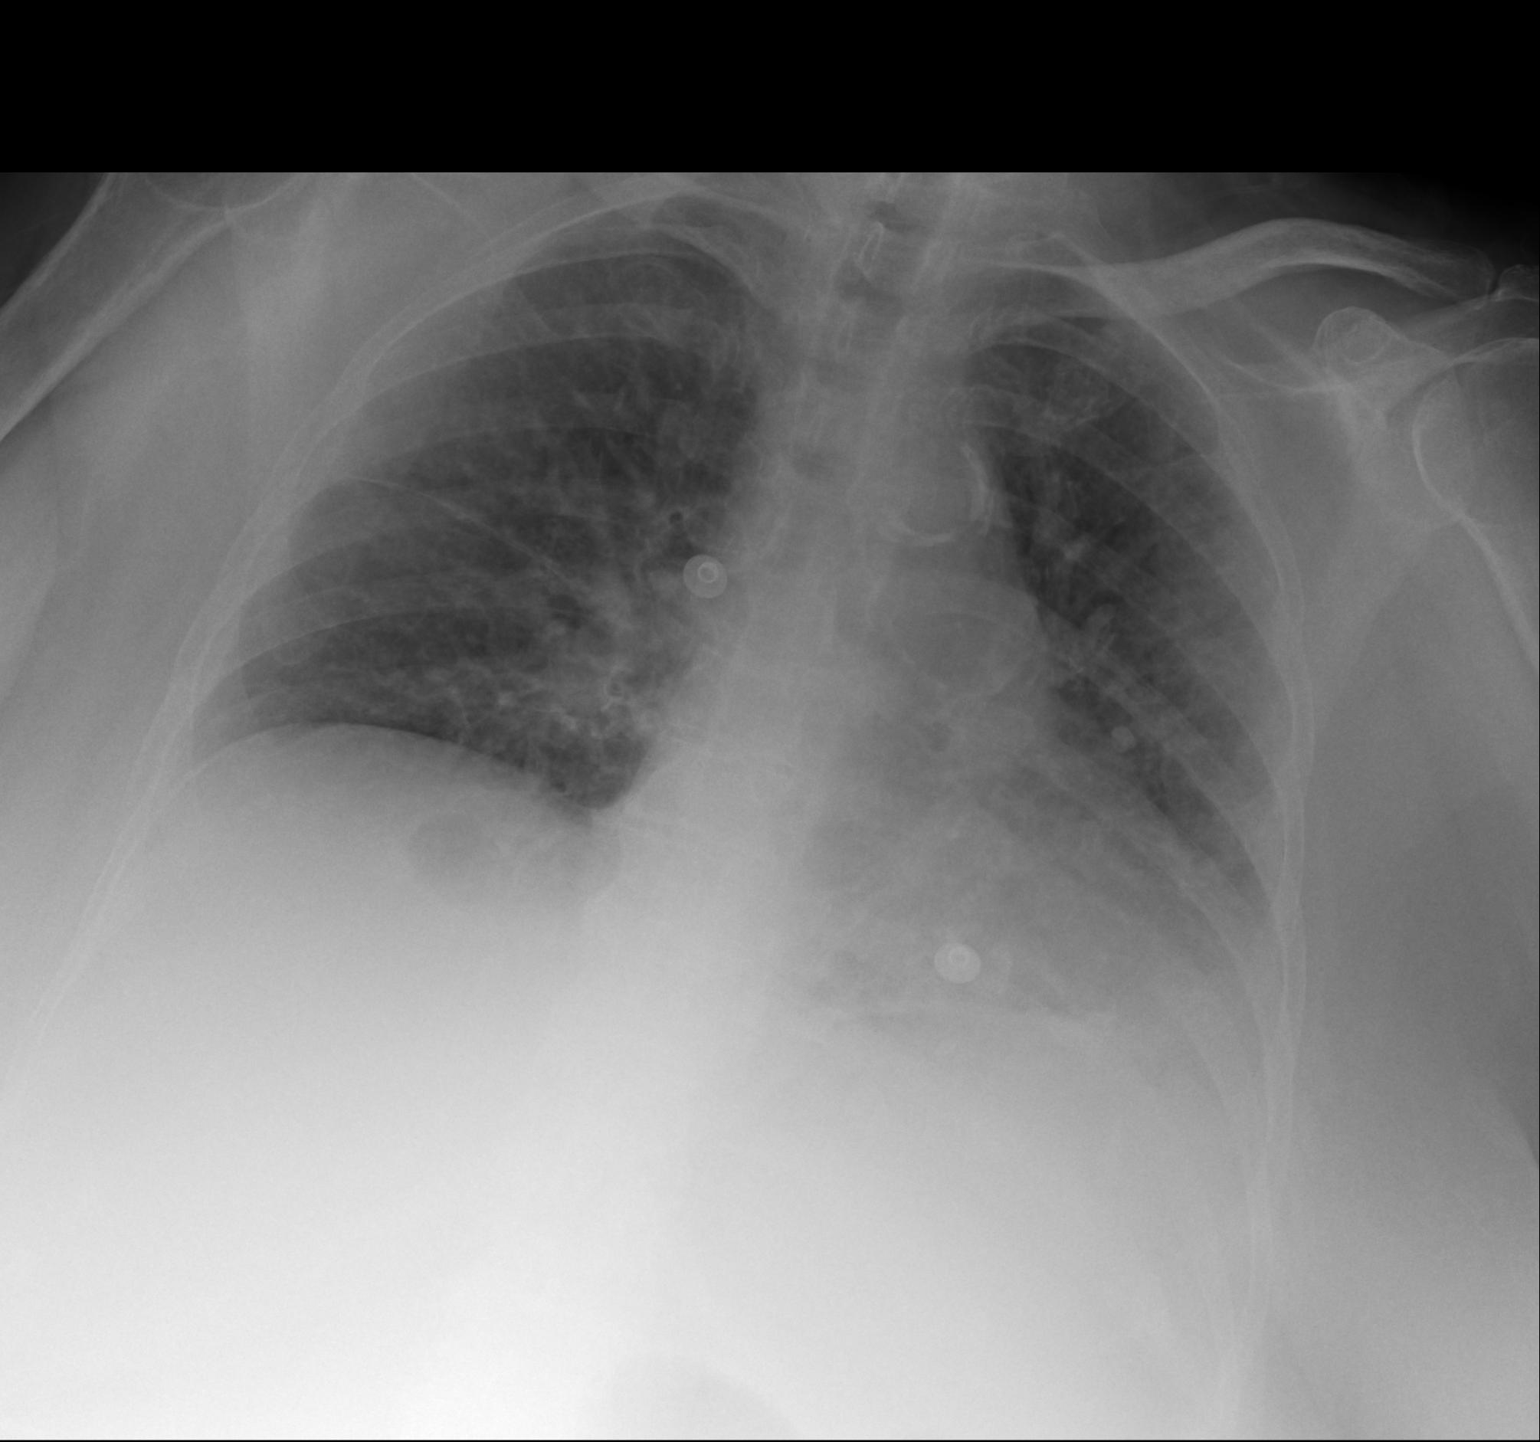

[1 of 1 positions shown; findings below may reference images not displayed]

FINDINGS: There is diffuse interstitial coarsening which is above baseline,
especially compared to 06/04/2013, and could be congestive or
bronchitic. Shallow inspiration accentuates this appearance. No
definite consolidation. No effusion or pneumothorax. Heart size
remains normal when accounting for technique.
IMPRESSION: Bronchitic or congestive interstitial coarsening.

## 2016-01-03 IMAGING — CT CT HEAD WITHOUT CONTRAST
2 of 3 series · 16 of 30 positions shown, 19 images · non-contrast
Comparison: 04/03/2014

CLINICAL DATA: Altered mental status. Dysphagia. Weakness. Not
moving right or left-side of body. Dementia.

EXAM:
CT HEAD WITHOUT CONTRAST
TECHNIQUE: Contiguous axial images were obtained from the base of the skull
through the vertex without intravenous contrast.

[Series 2: head wo · axial · 0.46mm/px · z∈[-40,+80]mm · 9 of 31 slices shown, 12 images]
[im 4/31  brain]
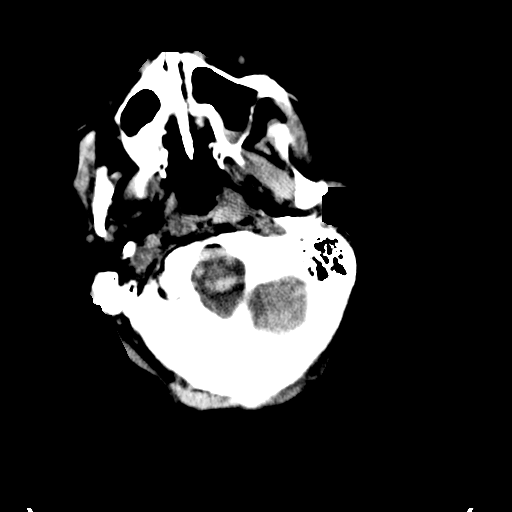
[im 4/31  bone]
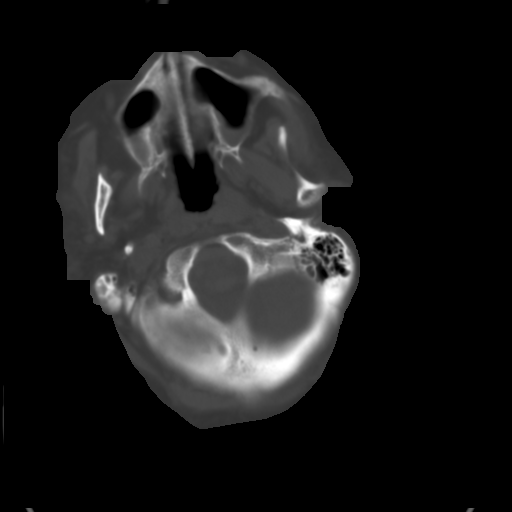
[im 7/31  brain]
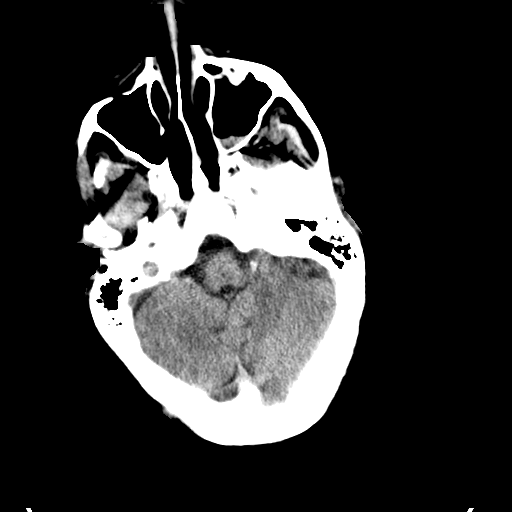
[im 10/31  brain]
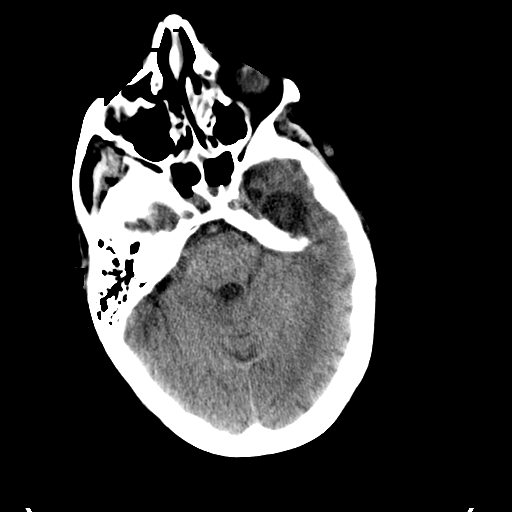
[im 13/31  brain]
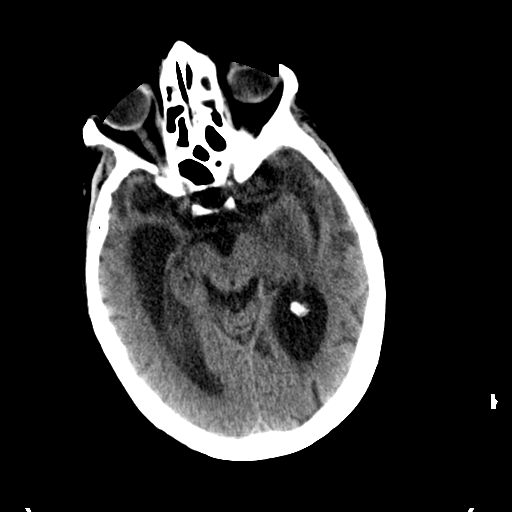
[im 16/31  brain]
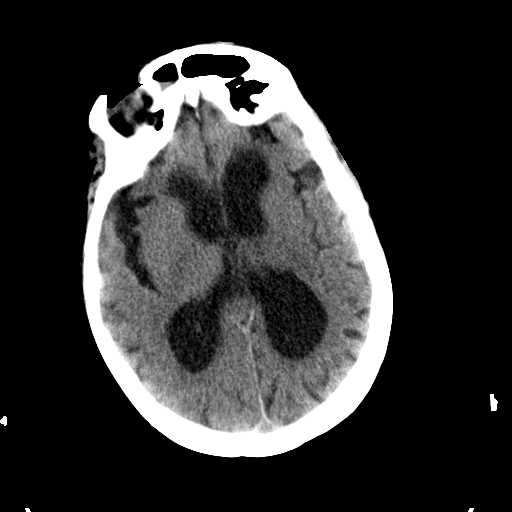
[im 16/31  bone]
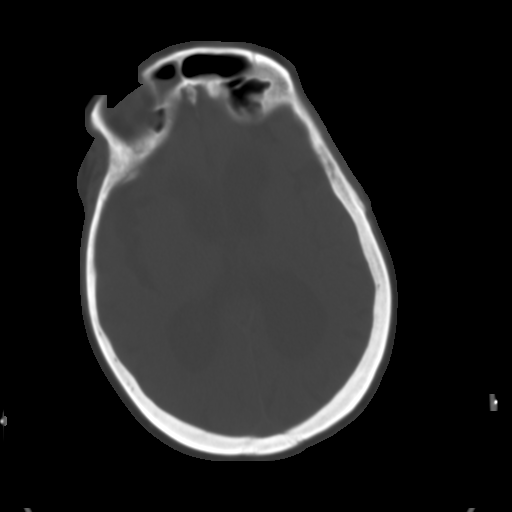
[im 19/31  brain]
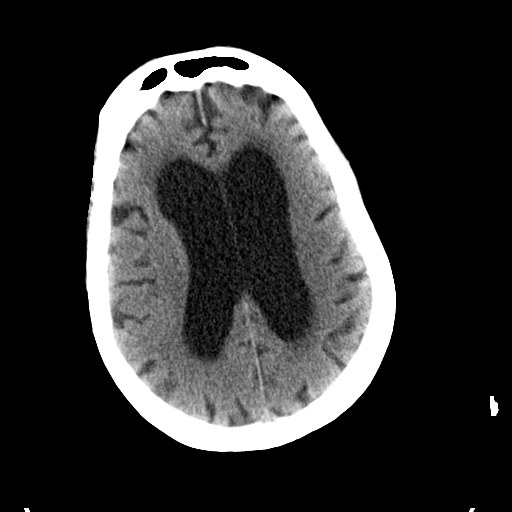
[im 22/31  brain]
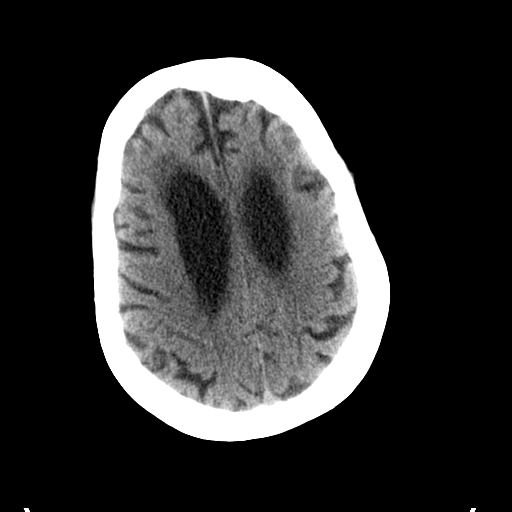
[im 25/31  brain]
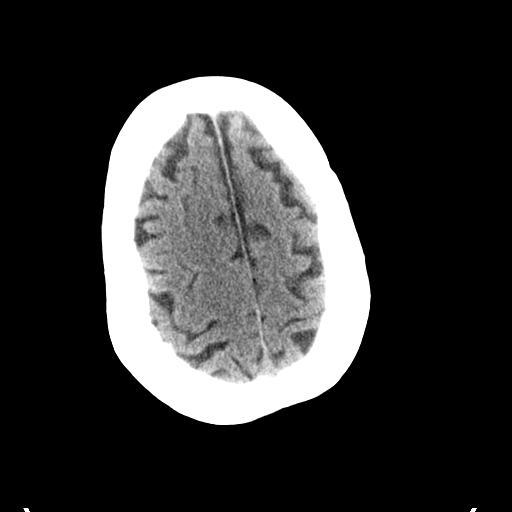
[im 28/31  brain]
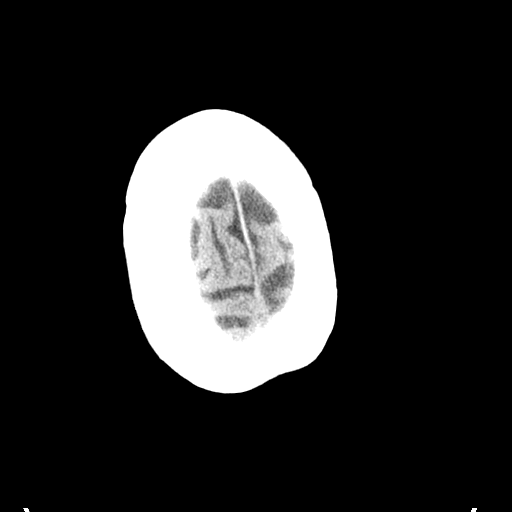
[im 28/31  bone]
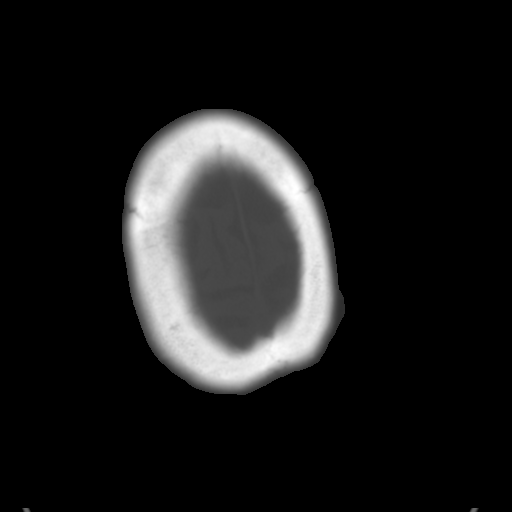

[Series 4: head wo rcon · axial · 0.46mm/px · z∈[+43,+131]mm · 7 of 27 slices shown]
[im 4/27  brain]
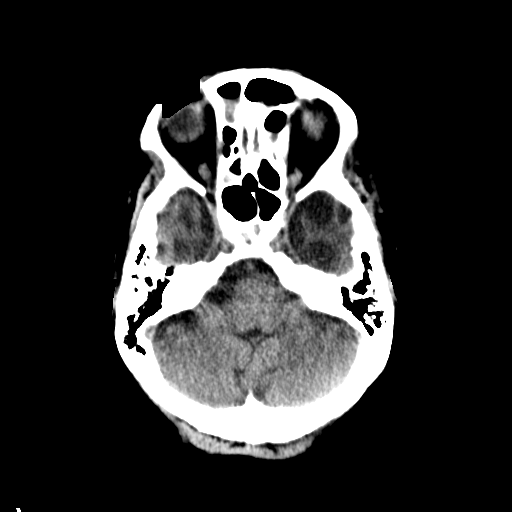
[im 7/27  brain]
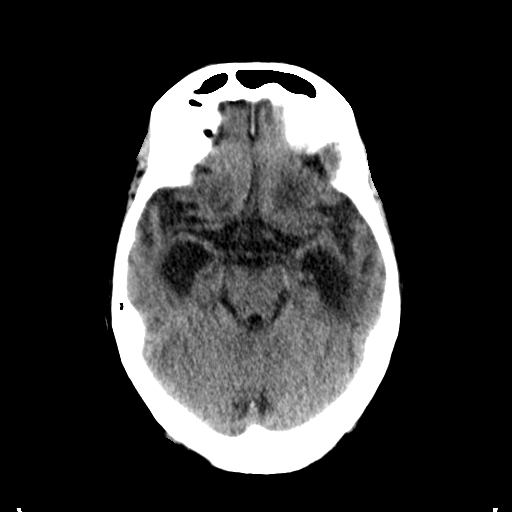
[im 10/27  brain]
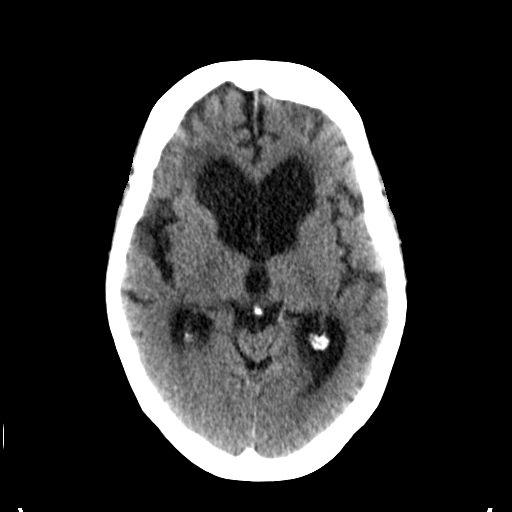
[im 14/27  brain]
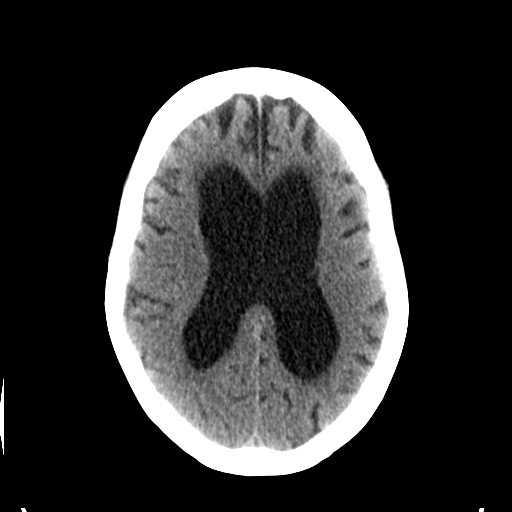
[im 17/27  brain]
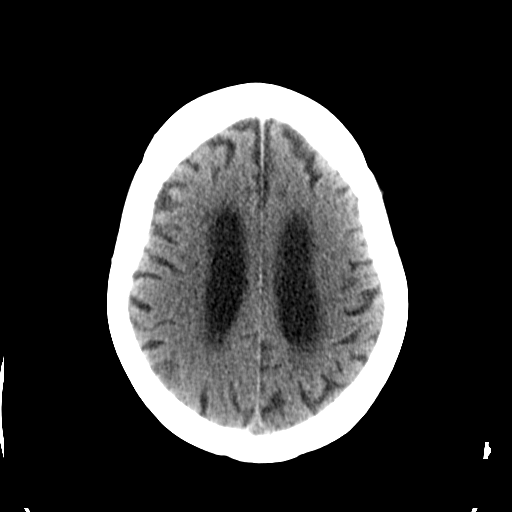
[im 20/27  brain]
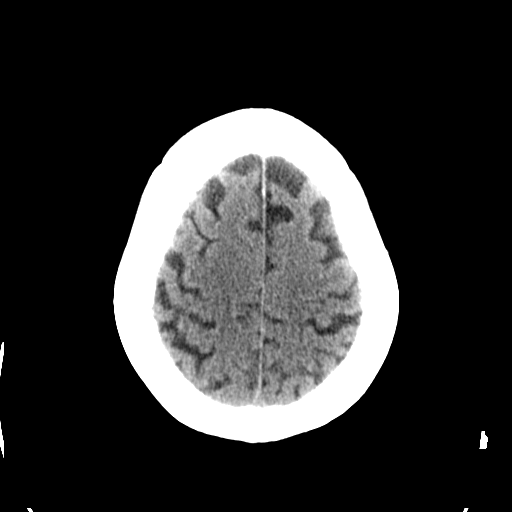
[im 23/27  brain]
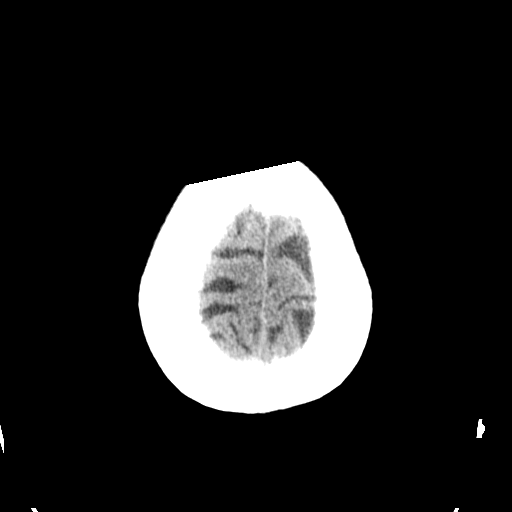

[16 of 30 positions shown; findings below may reference images not displayed]

FINDINGS: Stable low-level hypodensity in the left anterior pons extending
towards the cerebral peduncle. Poor definition of both external
capsules although this appears to be chronic. No dense MCA
appearance. Periventricular white matter and corona radiata
hypodensities favor chronic ischemic microvascular white matter
disease. Stable ventriculomegaly including the temporal horns.

No intracranial hemorrhage, mass lesion, or acute CVA. A scattered
opacification of ethmoid air cells with possible air-fluid level in
the right frontal sinus along with suspected acute on chronic
sphenoid sinusitis. Acute on chronic left maxillary sinusitis with
chronic right maxillary sinusitis. Chronic left frontal sinusitis.
IMPRESSION: 1. Stable ventriculomegaly, potentially ex vacuo or possibly related
to normal pressure hydrocephalus.
2. Periventricular white matter and corona radiata hypodensities
favor chronic ischemic microvascular white matter disease.
3. No intracranial hemorrhage, mass lesion, or acute CVA identified.
4. Acute on chronic paranasal sinusitis.

## 2016-01-06 IMAGING — CR DG CHEST 1V PORT
1 series · 1 of 1 positions shown · non-contrast
Comparison: 04/07/2014

CLINICAL DATA: Wheezing and shortness of Breath

EXAM:
PORTABLE CHEST - 1 VIEW

[ap]
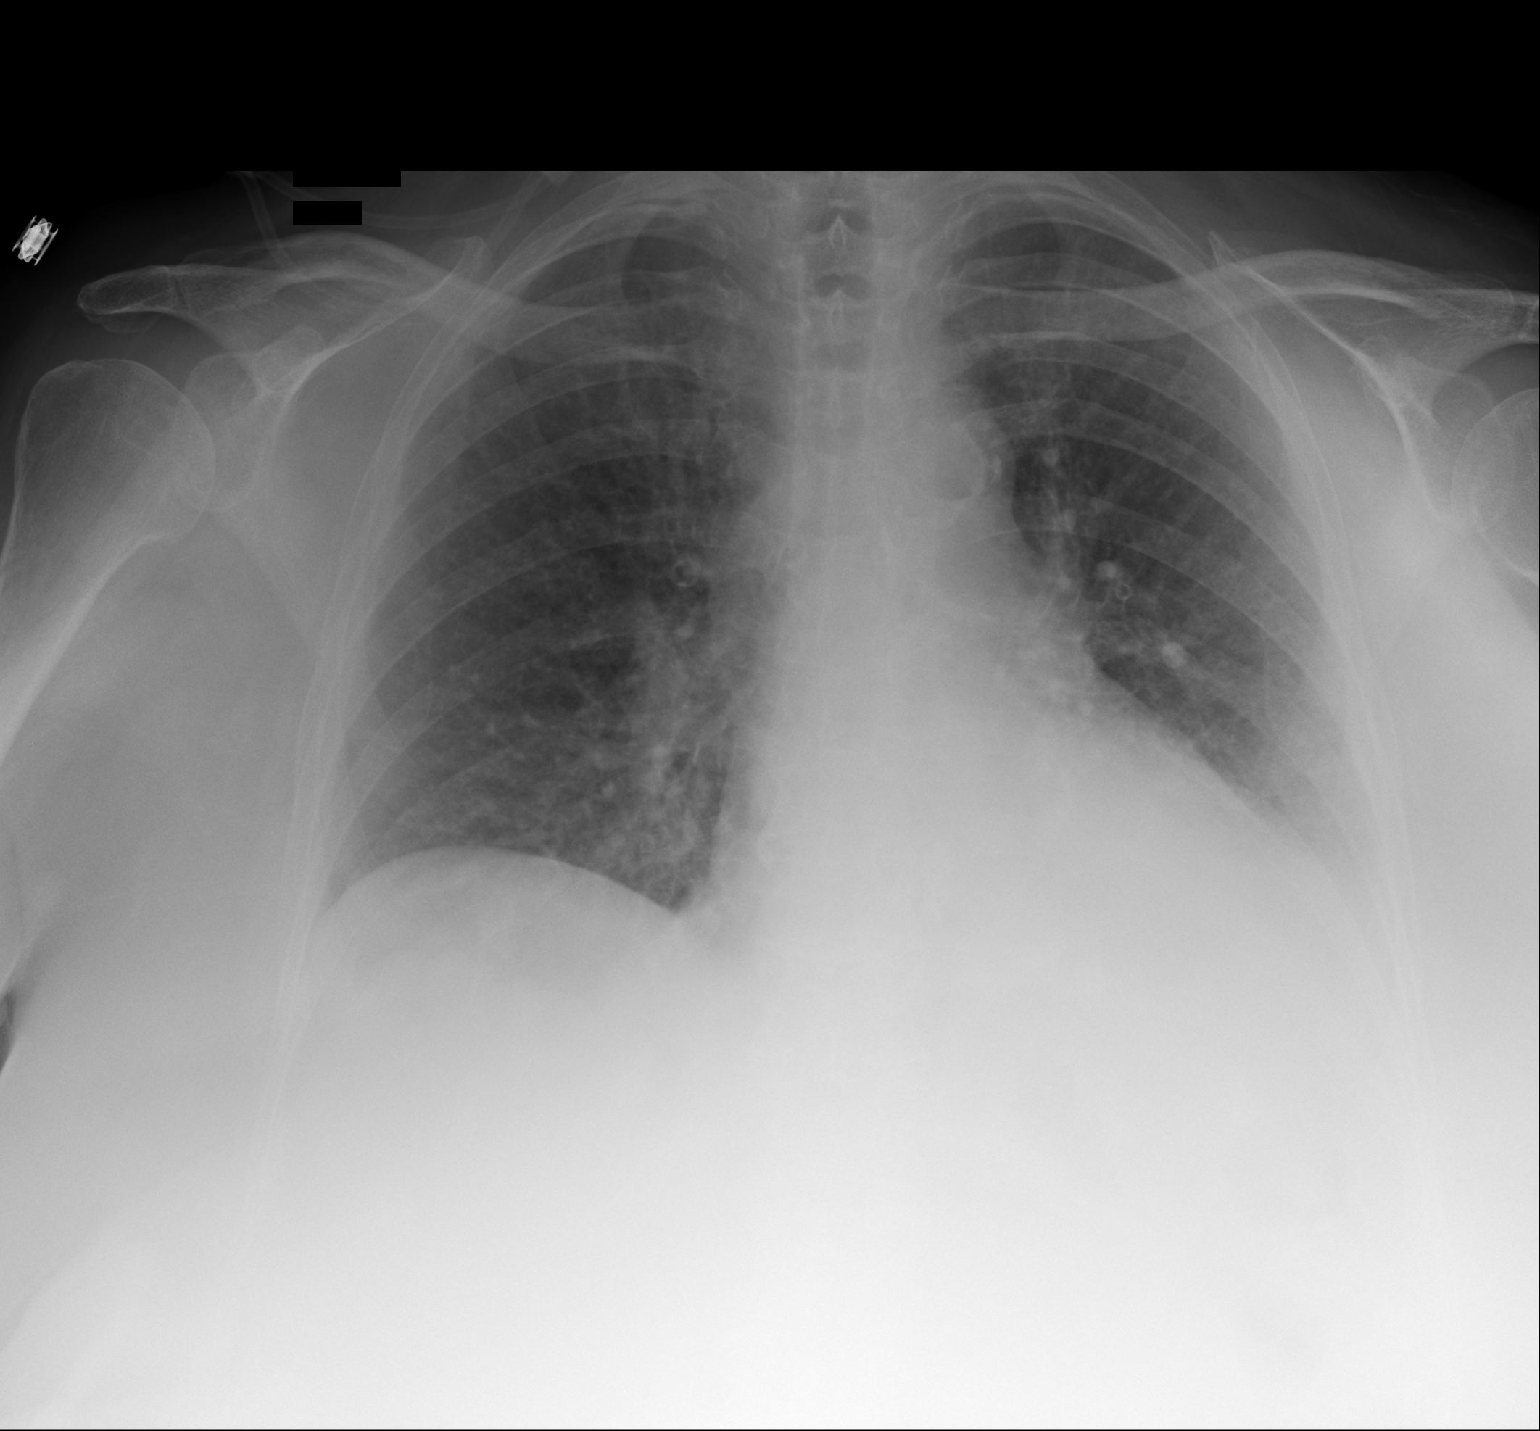

[1 of 1 positions shown; findings below may reference images not displayed]

FINDINGS: Cardiac shadow is stable. Increasing consolidation in the left lung
base is noted. No sizable effusion is noted. No pneumothorax is
seen. The cardiac shadow is stable.
IMPRESSION: Increasing consolidation in the left lung base.
